# Patient Record
Sex: Male | Born: 1985
Health system: Southern US, Community
[De-identification: ages and names within clinical notes are randomized; demographics above are authoritative.]

## PROBLEM LIST (undated history)

## (undated) DIAGNOSIS — F419 Anxiety disorder, unspecified: Secondary | ICD-10-CM

## (undated) DIAGNOSIS — F410 Panic disorder [episodic paroxysmal anxiety] without agoraphobia: Secondary | ICD-10-CM

## (undated) DIAGNOSIS — F319 Bipolar disorder, unspecified: Secondary | ICD-10-CM

## (undated) HISTORY — DX: Panic disorder (episodic paroxysmal anxiety): F41.0

## (undated) HISTORY — DX: Anxiety disorder, unspecified: F41.9

## (undated) HISTORY — DX: Bipolar disorder, unspecified: F31.9

## (undated) HISTORY — PX: NO PAST SURGERIES: SHX2092

---
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
## 2007-08-23 ENCOUNTER — Emergency Department: Payer: Self-pay | Admitting: Emergency Medicine

## 2009-04-03 ENCOUNTER — Inpatient Hospital Stay: Payer: Self-pay | Admitting: Psychiatry

## 2010-10-27 IMAGING — CT NM PET METABOLIC BRAIN
1 of 5 series · 7 of 25 positions shown · non-contrast
Comparison: none

REASON FOR EXAM: confusion, ams
COMMENTS:

[Series 3: ct brain 3.0 h31s · axial · 3.0mm · 0.49mm/px · z∈[-474,-350]mm · 7 of 110 slices shown]
[im 14/110  brain]
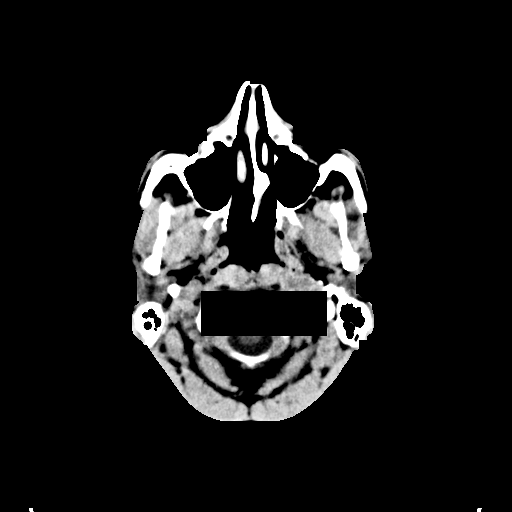
[im 28/110  brain]
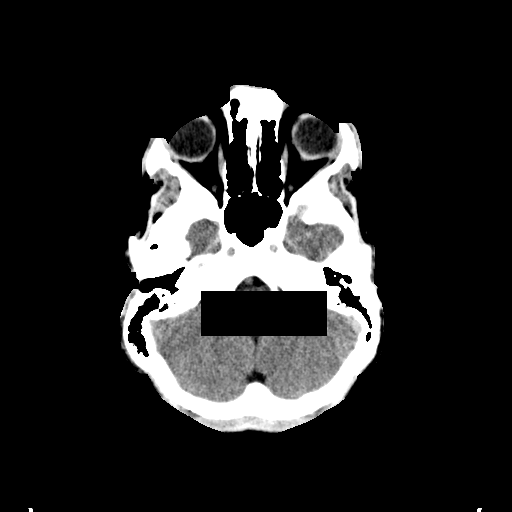
[im 41/110  brain]
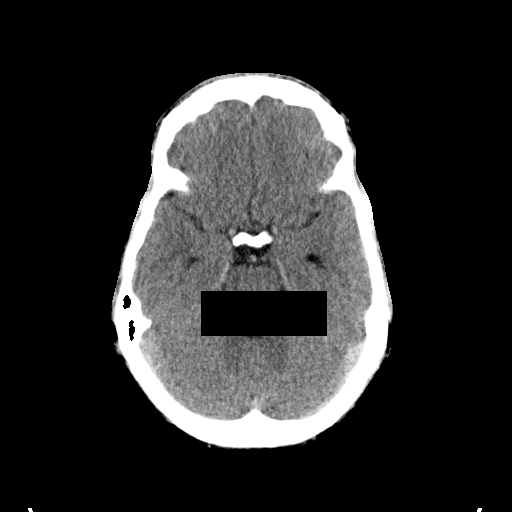
[im 55/110  brain]
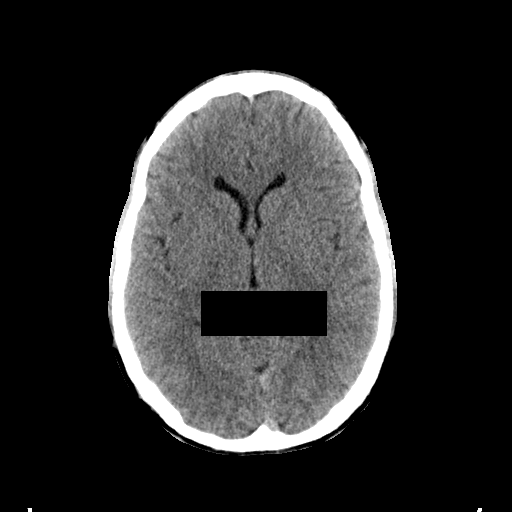
[im 69/110  brain]
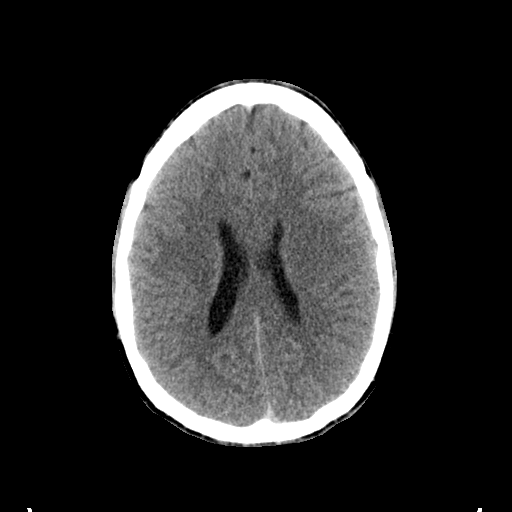
[im 82/110  brain]
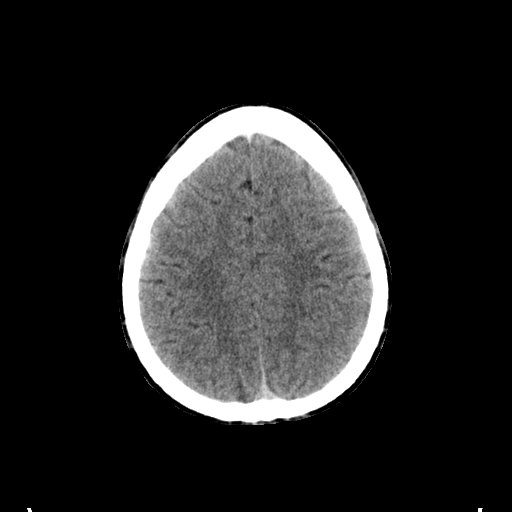
[im 96/110  brain]
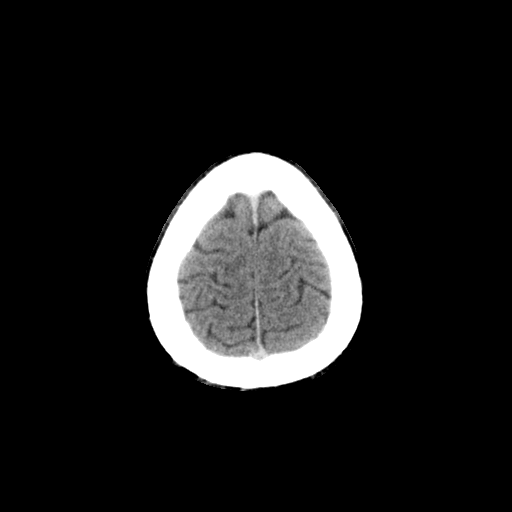

[7 of 25 positions shown; findings below may reference images not displayed]

PROCEDURE:     PET - PET/CT BRAIN PREOP SEIZURE  - April 07, 2009 [DATE]

RESULT:     The patient's fasting blood glucose level was 89 mg/dl. The
patient received an injection of 11.74 mCi of fluorine 18 labeled
fluorodeoxyglucose in the right antecubital region at [DATE] a.m. Imaging is
performed through the brain. There is no previous exam for comparison.

The ventricles and sulci are normal. There is no territorial infarct. There
does not appear to be atrophy or chronic small vessel ischemic disease.
There is no mass effect or evidence of hemorrhage. Distribution of
radiotracer appears symmetric and normal. No abnormal areas of increased or
decreased activity are demonstrated.
IMPRESSION: Normal PET CT of the brain.

## 2012-01-09 ENCOUNTER — Inpatient Hospital Stay: Payer: Self-pay | Admitting: Internal Medicine

## 2012-01-09 LAB — CBC WITH DIFFERENTIAL/PLATELET
Basophil #: 0.1 10*3/uL (ref 0.0–0.1)
Basophil %: 0.7 %
Eosinophil #: 0 10*3/uL (ref 0.0–0.7)
HCT: 41.1 % (ref 40.0–52.0)
Lymphocyte #: 1.1 10*3/uL (ref 1.0–3.6)
Lymphocyte %: 8.2 %
MCH: 30.1 pg (ref 26.0–34.0)
MCHC: 34.5 g/dL (ref 32.0–36.0)
MCV: 87 fL (ref 80–100)
Neutrophil #: 11.3 10*3/uL — ABNORMAL HIGH (ref 1.4–6.5)
RDW: 13.2 % (ref 11.5–14.5)

## 2012-01-09 LAB — COMPREHENSIVE METABOLIC PANEL
BUN: 19 mg/dL — ABNORMAL HIGH (ref 7–18)
Bilirubin,Total: 1.9 mg/dL — ABNORMAL HIGH (ref 0.2–1.0)
Chloride: 103 mmol/L (ref 98–107)
Co2: 26 mmol/L (ref 21–32)
Creatinine: 1.08 mg/dL (ref 0.60–1.30)
EGFR (African American): >60
EGFR (Non-African Amer.): >60
Osmolality: 278 (ref 275–301)
Potassium: 3.7 mmol/L (ref 3.5–5.1)
Sodium: 138 mmol/L (ref 136–145)
Total Protein: 7.4 g/dL (ref 6.4–8.2)

## 2012-01-09 LAB — URINALYSIS, COMPLETE
Bacteria: NONE SEEN
Blood: NEGATIVE
Nitrite: NEGATIVE
Ph: 7 (ref 4.5–8.0)
Protein: 30
Specific Gravity: 1.029 (ref 1.003–1.030)

## 2012-01-10 LAB — CBC WITH DIFFERENTIAL/PLATELET
Eosinophil #: 0.1 10*3/uL (ref 0.0–0.7)
Eosinophil %: 1 %
HCT: 38.5 % — ABNORMAL LOW (ref 40.0–52.0)
Lymphocyte %: 11.2 %
MCH: 29.8 pg (ref 26.0–34.0)
MCV: 88 fL (ref 80–100)
Monocyte #: 1.1 10*3/uL — ABNORMAL HIGH (ref 0.0–0.7)
Neutrophil %: 76.9 %
Platelet: 209 10*3/uL (ref 150–440)
WBC: 10.8 10*3/uL — ABNORMAL HIGH (ref 3.8–10.6)

## 2012-01-10 LAB — BASIC METABOLIC PANEL
Anion Gap: 9 (ref 7–16)
Creatinine: 0.98 mg/dL (ref 0.60–1.30)
EGFR (Non-African Amer.): >60
Osmolality: 283 (ref 275–301)

## 2012-01-11 LAB — CBC WITH DIFFERENTIAL/PLATELET
Basophil #: 0 10*3/uL (ref 0.0–0.1)
Eosinophil #: 0.2 10*3/uL (ref 0.0–0.7)
MCH: 29.7 pg (ref 26.0–34.0)
MCV: 90 fL (ref 80–100)
Monocyte #: 1.1 10*3/uL — ABNORMAL HIGH (ref 0.0–0.7)
Neutrophil #: 7.1 10*3/uL — ABNORMAL HIGH (ref 1.4–6.5)
Neutrophil %: 72.2 %
Platelet: 190 10*3/uL (ref 150–440)
RDW: 13.2 % (ref 11.5–14.5)
WBC: 9.8 10*3/uL (ref 3.8–10.6)

## 2012-01-11 LAB — URINE CULTURE

## 2012-01-12 LAB — CBC WITH DIFFERENTIAL/PLATELET
Basophil %: 0.5 %
Eosinophil %: 1.8 %
HGB: 12.4 g/dL — ABNORMAL LOW (ref 13.0–18.0)
Lymphocyte %: 17.7 %
MCH: 30 pg (ref 26.0–34.0)
Monocyte #: 0.7 10*3/uL (ref 0.0–0.7)
Monocyte %: 8.6 %
Neutrophil %: 71.4 %
RBC: 4.12 10*6/uL — ABNORMAL LOW (ref 4.40–5.90)

## 2012-01-12 LAB — SEDIMENTATION RATE: Erythrocyte Sed Rate: 44 mm/hr — ABNORMAL HIGH (ref 0–15)

## 2012-01-13 LAB — CBC WITH DIFFERENTIAL/PLATELET
Basophil #: 0.1 10*3/uL (ref 0.0–0.1)
Basophil %: 0.7 %
Eosinophil #: 0.2 10*3/uL (ref 0.0–0.7)
Eosinophil %: 3.4 %
Lymphocyte %: 27 %
MCH: 29.6 pg (ref 26.0–34.0)
MCHC: 33.5 g/dL (ref 32.0–36.0)
Monocyte %: 9.8 %
Neutrophil %: 59.1 %
Platelet: 236 10*3/uL (ref 150–440)
RBC: 4.17 10*6/uL — ABNORMAL LOW (ref 4.40–5.90)

## 2013-07-30 IMAGING — CR DG KNEE COMPLETE 4+V*R*
1 series · 4 of 4 positions shown · non-contrast
Comparison: none

REASON FOR EXAM: pain, swelling right knee
COMMENTS:

[Series 1: ap · 0.17mm/px · 4 of 4 slices shown]
[im 1/4]
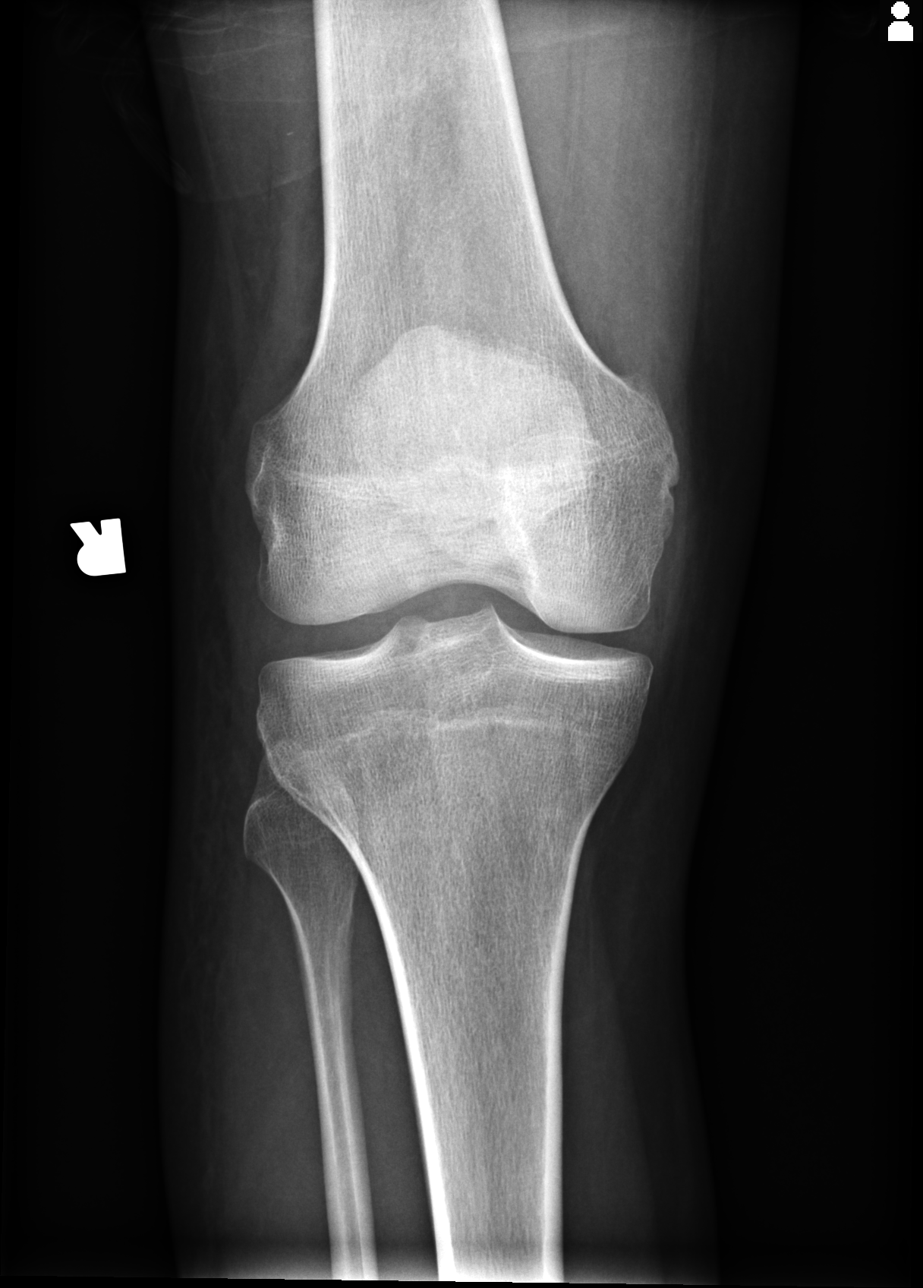
[im 2/4]
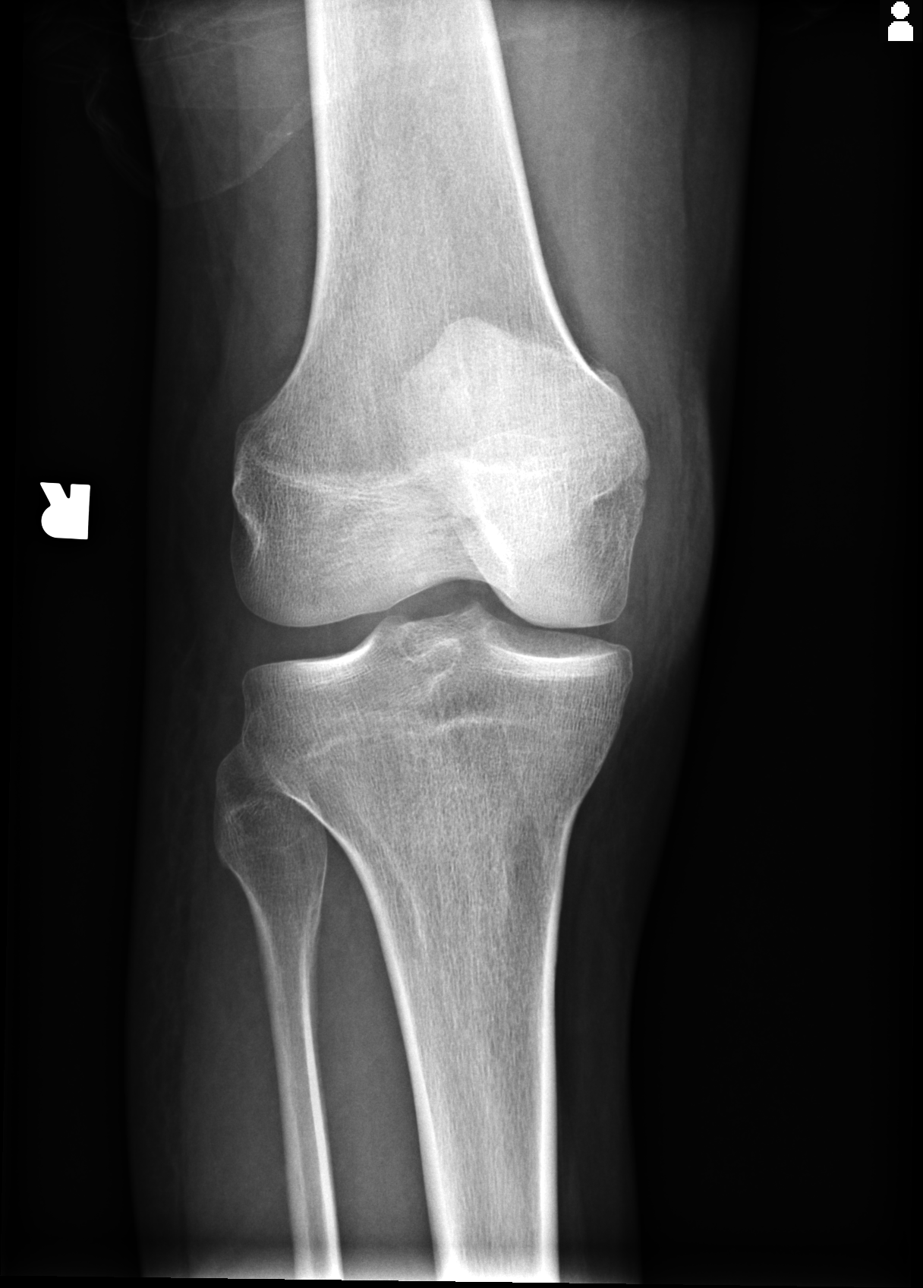
[im 3/4]
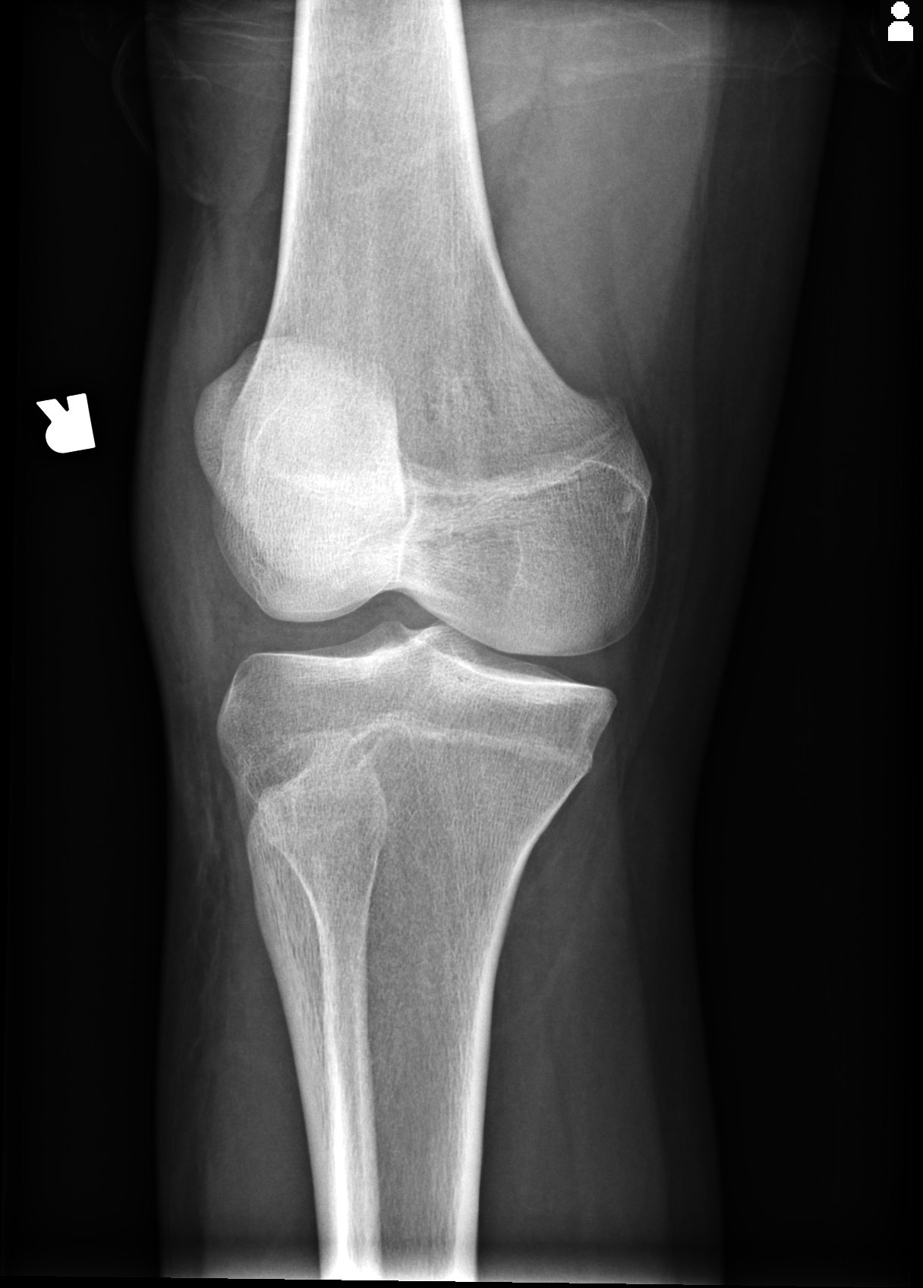
[im 4/4]
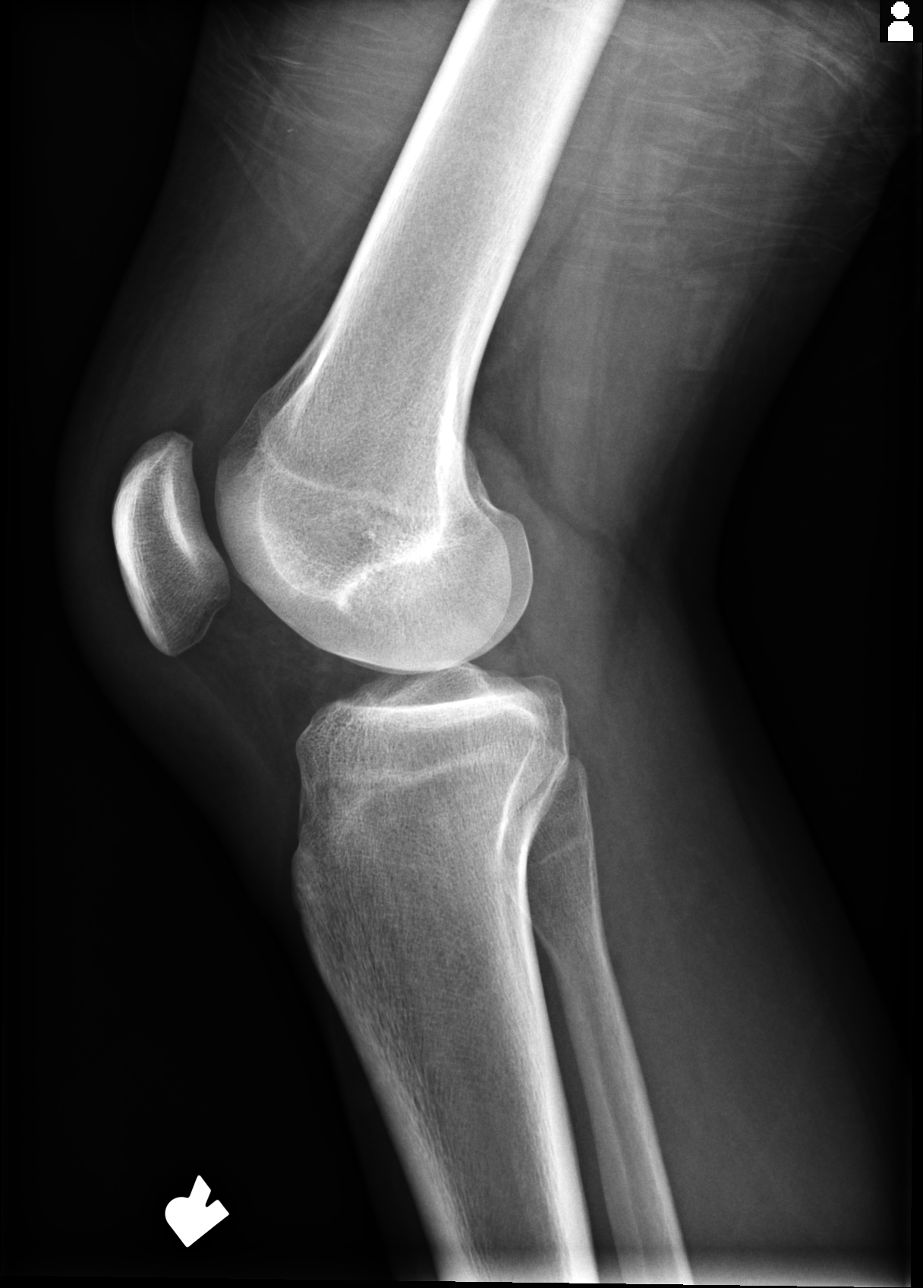

[4 of 4 positions shown; findings below may reference images not displayed]

PROCEDURE:     DXR - DXR KNEE RT COMP WITH OBLIQUES  - January 09, 2012  [DATE]

RESULT:     Four views of the knee were obtained. There is a transverse
radiodensity in the proximal fibula compatible with the epiphyseal closure
site. No fracture, dislocation or other acute bony abnormality is
identified. The knee joint space is well maintained. The patella is intact.
IMPRESSION: 1. No acute bony abnormalities are identified.
2. No soft tissue foreign body is seen.

## 2015-03-12 NOTE — Discharge Summary (Signed)
PATIENT NAME:  [name], Jeffery M MR#:  [id number] DATE OF BIRTH:  Oct 02, 1986  DATE OF ADMISSION:  01/09/2012 DATE OF DISCHARGE:  01/13/2012  DISCHARGE DIAGNOSES:  1. Right knee bursitis/cellulitis with systemic inflammatory response syndrome. 2. Bipolar disorder.  3. Leukocytosis, resolved.   DISPOSITION: The patient is being discharged home.   FOLLOWUP:  1. Follow up with Dr. Martha ClanKrasinski in 1 to 2 days after discharge. 2. Follow up with primary care physician, Dr. Thana AtesMorrisey, within 1 to 2 days of discharge.   DISCHARGE MEDICATIONS:  1. Bactrim Double Strength 1 tablet p.o. for 10 days.  2. Norco 5/325, 1 to 2 tablets p.o. every 4 to 6 hours p.r.n. severe pain.   CONSULTATIONS: Orthopedic consultation with Dr. Martha ClanKrasinski.   LABORATORY, DIAGNOSTIC AND RADIOLOGICAL DATA:  White count 3.7 on admission, normal by the time of discharge. Essentially normal platelet count. Hemoglobin ranging from 14.2 to 12.4. Normal comprehensive metabolic panel.  Blood cultures no growth so far.  Right knee x-ray: No acute abnormality.   HOSPITAL COURSE: The patient is 29 year old male with no significant past medical history other than bipolar disorder, not on any medications, who has to do a lot of kneeling at his place of work. He presented with pain and swelling of his right knee associated with fever, leukocytosis and tachycardia. The patient was admitted with a diagnosis of likely cellulitis/bursitis with SIRS and started on empiric vancomycin. Subsequently, he was changed on Ancef with some aggravation of the cellulitis/bursitis. He was switched back to vancomycin with good improvement. He has minimal erythema and swelling in his right knee. The patient is able to flex his knee completely with normal range of motion. He is ambulating without difficulty. He is being switched to oral Bactrim. Outpatient follow up with Dr. Martha ClanKrasinski has been arranged within 1 to 2 days. His leukocytosis has resolved. The  patient is being discharged home in a stable condition.   TIME SPENT: 45 minutes.  ____________________________ Darrick MeigsSangeeta Jermarion Poffenberger, MD sp:cbb D: 01/13/2012 16:02:57 ET T: 01/14/2012 12:25:28 ET JOB#: [id number]  cc: Darrick MeigsSangeeta Markela Wee, MD, <Dictator> Dennison MascotLemont Morrisey, MD Darrick MeigsSANGEETA Zyia Kaneko MD ELECTRONICALLY SIGNED 01/15/2012 14:11

## 2015-03-12 NOTE — Consult Note (Signed)
Brief Consult Note: Diagnosis: Right knee swelling and pain.   Patient was seen by consultant.   Recommend further assessment or treatment.   Discussed with Attending MD.   Comments: 29 y/o male awoke this AM with pain and swelling in his right knee.  He states that he has not had any recent knee trauma or open wounds that he can recall.  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  He states he does not use knee pads.  Patient states that his pain has improved since earlier today when he had difficulty bending his knee without discomfort, and he has been able to bear weight on it throughout the day.  He had a fever of 102 today and has a WBC of 13.7. On exam, he is seen with a male companion at the bedside in the hallway of the ER.  He does not appear ill and is in no acute distress.  The right knee is erythematous over the patella and there is mild faint streaking distally over the anterolateral tibia.  There is focal swelling over the patella consistent with bursitis, but no evidence of a knee effusion.  His range of motion is from 0-120 degrees with moderate discomfort in maximum flexion, but not in the midrange of motion.  He can perform a straight leg raise without lag.  His lower leg compartments are soft and compressible.  He has no calf tenderness.  He has 5/5 strength in all lower leg muscles to manual strength testing, intact sensation to light touch and palpable pedal pulses.  He is tender to palpation over the patella.  Assessment:  Right knee/leg cellulitis with possible septic bursitis.  No evidence for septic right knee joint.  I agree with admission to [facility] for administration of IV antibiotics for treatment of cellulitis.  I will continue to follow.  No indication for surgery at this time.  Will assess the patient's response to IV antibiotlcs tomorrow.  Electronic Signatures: Juanell FairlyKrasinski, Fabienne Nolasco (MD)  (Signed 21-Feb-13 21:50)  Authored: Brief Consult Note   Last  Updated: 21-Feb-13 21:50 by Juanell FairlyKrasinski, Seona Clemenson (MD)

## 2015-03-12 NOTE — H&P (Signed)
PATIENT NAME:  Jeffery Gomez, Jeffery Gomez MR#:  [id number] DATE OF BIRTH:  [birth year]  DATE OF ADMISSION:  01/09/2012  PRIMARY CARE PHYSICIAN: None.   CHIEF COMPLAINT: Swelling in the right knee.   HISTORY OF PRESENT ILLNESS: The patient is a 29 year old male with history of bipolar disorder. Today he presented to the Emergency Room with swelling of the right knee that started this morning after he woke up. He was complaining of some redness over the right knee area and painful right knee. 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 He never had any joint problems in the past. He has no previous history of gout. He said he was having fever today also to about 102/103. He had a documented fever of 102 in the Emergency Room. He was slightly nauseous, slightly dizzy. He worked until 3 o'clock and then he presented to the Emergency Room. When he came into the ER, he had a fever of 102, heart rate 128, and his white count was found to be 13.7. He said he is able to walk some on the right leg. He got a dose of vancomycin and ceftriaxone in the Emergency Room and 800 mg of ibuprofen. Hospitalist was asked to admit the patient because of possible bursitis, maybe infected bursitis. He is also complaining of a swollen lymph node draining in the same area in the right groin. Denies any abdominal pain. No chest pain or shortness of breath. He was also complaining of some neck pain before and sharp shooting pain in the lower back. Denies any neck stiffness.   REVIEW OF SYSTEMS: Positive for fever. No acute change in vision. No headache. He was complaining of some dizziness. No cough. No dyspnea. No chest pain or shortness of breath. No palpitations. Mild nausea but no vomiting, abdominal pain, diarrhea. No dysuria. No frequency. No anemia. No bleeding from any site. No rash. He is complaining of significant right knee pain. No focal numbness or weakness. He has  history of bipolar disorder.   PAST MEDICAL HISTORY: Bipolar disorder. He was admitted to Behavioral Medicine in May of 2010 because of depression.   PAST SURGICAL HISTORY: None.   HOME MEDICATIONS: None.   ALLERGIES: Depakote.   SOCIAL HISTORY: He works doing Pension scheme managerpainting jobs on cars. He chews tobacco. He drinks alcohol socially. Denies any drug use.   FAMILY HISTORY: No significant medical conditions as per the patient.   PHYSICAL EXAMINATION:   VITAL SIGNS: When he presented to the Emergency Room, temperature 102, heart rate 128, respiratory rate 24, blood pressure 119/64, saturating 98% on room air.   GENERAL: This is a young Caucasian male comfortably sitting in bed nontoxic in appearance.   HEENT: Bilateral pupils are equal and reactive. Extraocular muscles intact. No scleral icterus. No conjunctivitis. Oral mucosa is moist. No pallor.   NECK: No thyroid tenderness, enlargement, or nodules. Neck is supple. No masses, nontender. No adenopathy. No JVD. No carotid bruits.   CHEST: Bilateral breath sounds are clear. No wheeze. Normal effort. No respiratory distress.   HEART: Heart sounds are regular. No murmur. Good peripheral pulses. No lower extremity edema.   ABDOMEN: Soft, nontender. Normal bowel sounds. No hepatosplenomegaly. No bruit. No masses.   RECTAL: Deferred.   NEUROLOGIC: He is awake, alert, oriented to time, place, and person. Cranial nerves are intact. Moving all extremities against gravity.   EXTREMITIES: No cyanosis. No clubbing.   SKIN: His  right knee is swollen compared to the left side. It is mostly anteriorly. No posterior effusion. He has erythema on the anterior aspect. He has a chronically inflamed area at the lower part of the right knee. He has some mild tenderness on extension of the knee.   LABORATORY, DIAGNOSTIC, AND RADIOLOGICAL DATA: White count 13.7, hemoglobin 14.2, platelet count 229,000. BMP sodium 138, potassium 3.7, BUN 19, creatinine 1.08,  bilirubin 1.9, otherwise, ALT and AST normal.   IMPRESSION: Painful right knee, suspect infected bursitis, rule out septic arthritis, systemic inflammatory response syndrome with leukocytosis, tachycardia, and fever secondary to infected bursitis, leukocytosis.   PLAN: This is a 29 year old male with history of bipolar disorder not taking any medications at home, no history of any trauma to the knee, who presented with sudden onset of right knee pain, swelling, and redness. 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 He has some evidence of systemic inflammatory response syndrome also with fever, leukocytosis, and tachycardia. It looks like an infected bursitis. Discussed with Orthopedics. I'Gomez going to start him on IV vancomycin. As per Orthopedics, usually bursitis responds to IV antibiotics. If he does not respond, then he may need arthrocentesis tomorrow. Will get an Orthopedic consult on him. Will also give him IV hydration. I'll start him on some nonsteroidal anti-inflammatory agent also and give him Norco p.r.n.   Plan was discussed with the patient, also discussed the case with Dr. Martha Clan.   TIME SPENT WITH ADMISSION AND COORDINATION OF CARE: 55 minutes.    ____________________________ Fredia Sorrow, MD ag:drc D: 01/09/2012 21:12:54 ET T: 01/10/2012 06:03:54 ET JOB#: [id number]  cc: Fredia Sorrow, MD, <Dictator> Fredia Sorrow MD ELECTRONICALLY SIGNED 01/27/2012 11:32

## 2017-12-30 ENCOUNTER — Ambulatory Visit: Payer: No Typology Code available for payment source | Admitting: Family Medicine

## 2017-12-30 ENCOUNTER — Encounter: Payer: Self-pay | Admitting: Family Medicine

## 2017-12-30 VITALS — BP 120/70 | HR 100 | Temp 98.4°F | Resp 18 | Ht 69.0 in | Wt 164.2 lb

## 2017-12-30 DIAGNOSIS — F411 Generalized anxiety disorder: Secondary | ICD-10-CM

## 2017-12-30 DIAGNOSIS — Z7689 Persons encountering health services in other specified circumstances: Secondary | ICD-10-CM | POA: Diagnosis not present

## 2017-12-30 DIAGNOSIS — Z23 Encounter for immunization: Secondary | ICD-10-CM

## 2017-12-30 DIAGNOSIS — F419 Anxiety disorder, unspecified: Secondary | ICD-10-CM | POA: Insufficient documentation

## 2017-12-30 DIAGNOSIS — F330 Major depressive disorder, recurrent, mild: Secondary | ICD-10-CM

## 2017-12-30 MED ORDER — ESCITALOPRAM OXALATE 10 MG PO TABS: 10.0000 mg | ORAL_TABLET | Freq: Every day | ORAL | 0 refills | Status: DC

## 2017-12-30 MED ORDER — HYDROXYZINE HCL 10 MG PO TABS: 10.0000 mg | ORAL_TABLET | Freq: Two times a day (BID) | ORAL | 0 refills | Status: DC | PRN

## 2017-12-30 NOTE — Patient Instructions (Addendum)
PLEASE go to this link to find a therapist to help with techniques to calm your anxiety:   https://www.psychologytoday.com/us/therapists  Here are some resources to help you if you feel you are in a mental health crisis:  National Suicide Prevention Lifeline - Call 307-733-1289  for help - Website with more resources: ARanked.fi  [employer] [facility] - Call 216-609-1201 for help. - Mobile Crisis Program available 24 hours a day, 365 days a year. - Available for anyone of any age in [place] & [place] counties.  [facility] - Address: [address], [place] - Telephone: [phone]  - Hours of Operation: Sunday - Saturday - 8:00 a.m. - 8:00 p.m. - Medicaid, Medicare (Government Issued Only), BCBS, and [employer] - Pay - Crisis Management, Outpatient Individual & Group Therapy, Psychiatrists on-site to provide medication management, In-Home Psychiatric Care, and Peer Support Care.  [facility] - Call [phone] for help. - Mobile Crisis Program available 24 hours a day, 365 days a year. - Available for anyone of any age in [place] & [place]  12 Ways to Curb Anxiety  ?Anxiety is normal human sensation. It is what helped our ancestors survive the pitfalls of the wilderness. Anxiety is defined as experiencing worry or nervousness about an imminent event or something with an uncertain outcome. It is a feeling experienced by most people at some point in their lives. Anxiety can be triggered by a very personal issue, such as the illness of a loved one, or an event of global proportions, such as a refugee crisis. Some of the symptoms of anxiety are:  Feeling restless.  Having a feeling of impending danger.  Increased heart rate.  Rapid breathing. Sweating.  Shaking.  Weakness or feeling tired.  Difficulty concentrating on anything except the current worry.  Insomnia.  Stomach  or bowel problems. What can we do about anxiety we may be feeling? There are many techniques to help manage stress and relax. Here are 12 ways you can reduce your anxiety almost immediately: 1. Turn off the constant feed of information. Take a social media sabbatical. Studies have shown that social media directly contributes to social anxiety.  2. Monitor your television viewing habits. Are you watching shows that are also contributing to your anxiety, such as 24-hour news stations? Try watching something else, or better yet, nothing at all. Instead, listen to music, read an inspirational book or practice a hobby. 3. Eat nutritious meals. Also, don't skip meals and keep healthful snacks on hand. Hunger and poor diet contributes to feeling anxious. 4. Sleep. Sleeping on a regular schedule for at least seven to eight hours a night will do wonders for your outlook when you are awake. 5. Exercise. Regular exercise will help rid your body of that anxious energy and help you get more restful sleep. 6. Try deep (diaphragmatic) breathing. Inhale slowly through your nose for five seconds and exhale through your mouth. 7. Practice acceptance and gratitude. When anxiety hits, accept that there are things out of your control that shouldn't be of immediate concern.  8. Seek out humor. When anxiety strikes, watch a funny video, read jokes or call a friend who makes you laugh. Laughter is healing for our bodies and releases endorphins that are calming. 9. Stay positive. Take the effort to replace negative thoughts with positive ones. Try to see a stressful situation in a positive light. Try to come up with solutions rather than dwelling on the problem. 10. Figure out  what triggers your anxiety. Keep a journal and make note of anxious moments and the events surrounding them. This will help you identify triggers you can avoid or even eliminate. 11. Talk to someone. Let a trusted friend, family member or even trained  professional know that you are feeling overwhelmed and anxious. Verbalize what you are feeling and why.  12. Volunteer. If your anxiety is triggered by a crisis on a large scale, become an advocate and work to resolve the problem that is causing you unease. Anxiety is often unwelcome and can become overwhelming. If not kept in check, it can become a disorder that could require medical treatment. However, if you take the time to care for yourself and avoid the triggers that make you anxious, you will be able to find moments of relaxation and clarity that make your life much more enjoyable.

## 2017-12-30 NOTE — Progress Notes (Signed)
Name: Jeffery Gomez   MRN: [id number]    DOB: 04-Aug-1986   Date:12/30/2017       Progress Note  Subjective  Chief Complaint  Chief Complaint  Patient presents with  . Establish Care  . Anxiety  . Stress  . Shortness of Breath    HPI  Anxiety: He notes that he had some episodes of severe depression and anxiety back when he was 14-32yo that came and went until he was about 32 years old - he says this was typically after he drank heavily from alcohol or some kind of stressful trigger.  Over the last 12 years he has had anxiety that comes and goes - tries breathing exercises, getting good amounts of sleep, and tries to avoid caffeine as much as possible.  He bites his nails and still experiences several days a week with anxiety - says he feels like he "ponders" on certain things, sometimes has mild shortness of breath with anxiety.   Denies any visual, auditory, or tactile hallucinations, SI/HI.  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  He is married, has 3 children (68 (boy), 75 (girl), and 4(boy).  He tried Lexapro in the past and this worked very well for him.    There are no active problems to display for this patient.  Past Surgical History:  Procedure Laterality Date  . NO PAST SURGERIES     No family history on file.  Social History   Socioeconomic History  . Marital status: Single    Spouse name: Not on file  . Number of children: Not on file  . Years of education: Not on file  . Highest education level: Not on file  Social Needs  . Financial resource strain: Not on file  . Food insecurity - worry: Not on file  . Food insecurity - inability: Not on file  . Transportation needs - medical: Not on file  . Transportation needs - non-medical: Not on file  Occupational History  . Not on file  Tobacco Use  . Smoking status: Never Smoker  . Smokeless tobacco: Current User    Types: Chew  Substance and Sexual Activity  . Alcohol use: Yes    Comment: occasinal  .  Drug use: No  . Sexual activity: Yes  Other Topics Concern  . Not on file  Social History Narrative  . Not on file    No current outpatient medications on file.  Allergies  Allergen Reactions  . Depakote [Divalproex Sodium] Anaphylaxis    ROS Constitutional: Negative for fever or weight change.  Respiratory: Negative for cough; see HPI  Cardiovascular: Negative for chest pain or palpitations.  Gastrointestinal: Negative for abdominal pain, no bowel changes.  Musculoskeletal: Negative for gait problem or joint swelling.  Skin: Negative for rash.  Neurological: Negative for dizziness or headache.  Psych: See HPI No other specific complaints in a complete review of systems (except as listed in HPI above).  Objective  Vitals:   12/30/17 0924  BP: 120/70  Pulse: 100  Resp: 18  Temp: 98.4 F (36.9 C)  TempSrc: Oral  SpO2: 96%  Weight: 164 lb 3.2 oz (74.5 kg)  Height: 5\' 9"  (1.753 m)   Body mass index is 24.25 kg/m.  Physical Exam Constitutional: Patient appears well-developed and well-nourished. No distress.  HENT: Head: Normocephalic and atraumatic. Ears: B TMs ok, no erythema or effusion; Nose: Nose normal. Mouth/Throat: Oropharynx is clear and moist. No oropharyngeal exudate.  Eyes:  Conjunctivae and EOM are normal. Pupils are equal, round, and reactive to light. No scleral icterus.  Neck: Normal range of motion. Neck supple. No JVD present. No thyromegaly present.  Cardiovascular: Normal rate, regular rhythm and normal heart sounds.  No murmur heard. No BLE edema. Pulmonary/Chest: Effort normal and breath sounds normal. No respiratory distress. Abdominal: Soft. Bowel sounds are normal, no distension. There is no tenderness. no masses Musculoskeletal: Normal range of motion, no joint effusions. No gross deformities Neurological: he is alert and oriented to person, place, and time. No cranial nerve deficit. Coordination, balance, strength, speech and gait are normal.   Skin: Skin is warm and dry. No rash noted. No erythema.  Psychiatric: Patient has a normal mood and affect. behavior is normal. Judgment and thought content normal.  No results found for this or any previous visit (from the past 72 hour(s)).  PHQ2/9: Depression screen [facility] 12/30/2017 12/30/2017  Decreased Interest 1 1  Down, Depressed, Hopeless 1 0  PHQ - 2 Score 2 1  Altered sleeping 0 -  Tired, decreased energy 3 -  Change in appetite 0 -  Feeling bad or failure about yourself  1 -  Trouble concentrating 3 -  Moving slowly or fidgety/restless 0 -  Suicidal thoughts 0 -  PHQ-9 Score 9 -  Difficult doing work/chores Very difficult -   GAD 7 : Generalized Anxiety Score 12/30/2017  Nervous, Anxious, on Edge 3  Control/stop worrying 3  Worry too much - different things 3  Trouble relaxing 2  Restless 3  Easily annoyed or irritable 3  Afraid - awful might happen 3  Total GAD 7 Score 20  Anxiety Difficulty Somewhat difficult    MDQ -  Question 1: 3 Yes Question 2: No Question 3: Minor Problem Question 5: Yes Question 6: Yes Result: Negative Screen  Fall Risk: Fall Risk  12/30/2017  Falls in the past year? No    Assessment & Plan  1. Generalized anxiety disorder - escitalopram (LEXAPRO) 10 MG tablet; Take 1 tablet (10 mg total) by mouth daily.  Dispense: 15 tablet; Refill: 0 - hydrOXYzine (ATARAX/VISTARIL) 10 MG tablet; Take 1 tablet (10 mg total) by mouth 2 (two) times daily as needed.  Dispense: 20 tablet; Refill: 0 - BASIC METABOLIC PANEL WITH GFR - TSH - CBC  2. Mild episode of recurrent major depressive disorder (HCC) - escitalopram (LEXAPRO) 10 MG tablet; Take 1 tablet (10 mg total) by mouth daily.  Dispense: 15 tablet; Refill: 0 - hydrOXYzine (ATARAX/VISTARIL) 10 MG tablet; Take 1 tablet (10 mg total) by mouth 2 (two) times daily as needed.  Dispense: 20 tablet; Refill: 0 - BASIC METABOLIC PANEL WITH GFR - TSH - CBC - We will check labs today to ensure  anxiety is not being created by other pathology - MDQ is negative, so we will proceed with Lexapro at this time, however I advise that he monitor his symptoms closely as he has been told by a previous provider in his teens that he may have bipolar. We will maintain close follow up.  3. Encounter to establish care Return in about 2 weeks (around 01/13/2018) for 2wk Anxiety Follow Up; also schedule CPE w/ fasting labs.  4. Needs flu shot - Flu Vaccine QUAD 6+ mos PF IM (Fluarix Quad PF)  5. Need for Tdap vaccination - Tdap vaccine greater than or equal to 7yo IM

## 2017-12-31 LAB — CBC
HCT: 45 % (ref 38.5–50.0)
Hemoglobin: 15.6 g/dL (ref 13.2–17.1)
MCH: 29.1 pg (ref 27.0–33.0)
MCHC: 34.7 g/dL (ref 32.0–36.0)
MCV: 84 fL (ref 80.0–100.0)
MPV: 9.4 fL (ref 7.5–12.5)
Platelets: 330 10*3/uL (ref 140–400)
RBC: 5.36 10*6/uL (ref 4.20–5.80)
RDW: 12.6 % (ref 11.0–15.0)
WBC: 7.1 10*3/uL (ref 3.8–10.8)

## 2017-12-31 LAB — BASIC METABOLIC PANEL WITH GFR
BUN: 15 mg/dL (ref 7–25)
CO2: 28 mmol/L (ref 20–32)
Calcium: 10.1 mg/dL (ref 8.6–10.3)
Chloride: 104 mmol/L (ref 98–110)
Creat: 0.84 mg/dL (ref 0.60–1.35)
GFR, EST AFRICAN AMERICAN: 134 mL/min/{1.73_m2} (ref >=60)
GFR, Est Non African American: 116 mL/min/{1.73_m2} (ref >=60)
Glucose, Bld: 100 mg/dL (ref 65–139)
Potassium: 4.7 mmol/L (ref 3.5–5.3)
Sodium: 137 mmol/L (ref 135–146)

## 2017-12-31 LAB — TSH: TSH: 1.03 mIU/L (ref 0.40–4.50)

## 2018-01-14 ENCOUNTER — Encounter: Payer: Self-pay | Admitting: Family Medicine

## 2018-01-14 ENCOUNTER — Ambulatory Visit: Payer: No Typology Code available for payment source | Admitting: Family Medicine

## 2018-01-14 VITALS — BP 108/62 | HR 93 | Temp 98.7°F | Resp 18 | Ht 69.0 in | Wt 164.1 lb

## 2018-01-14 DIAGNOSIS — F411 Generalized anxiety disorder: Secondary | ICD-10-CM

## 2018-01-14 DIAGNOSIS — F330 Major depressive disorder, recurrent, mild: Secondary | ICD-10-CM

## 2018-01-14 MED ORDER — ESCITALOPRAM OXALATE 10 MG PO TABS: 10.0000 mg | ORAL_TABLET | Freq: Every day | ORAL | 1 refills | Status: DC

## 2018-01-14 NOTE — Patient Instructions (Addendum)

## 2018-01-14 NOTE — Progress Notes (Signed)
Name: Jeffery Gomez   MRN: [id number]    DOB: [birth year]   Date:01/14/2018       Progress Note  Subjective  Chief Complaint  Chief Complaint  Patient presents with  . Follow-up    2 week recheck on depression    HPI  Anxiety: He started Lexapro 10mg  2 weeks ago and has been feeling progressively better.  He has taken 2 doses of hydroxyzine which helped his anxiety but did make him feel groggy.  Still having episodes of increased anxiety but these have significantly lessened in severity and length of time.  Work has been much more manageable. He denies any SE's - no headaches or nausea.  Denies any visual, auditory, or tactile hallucinations, SI/HI.  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  He is married, has 3 children ([id number] (boy), 4610 (girl), and 4(boy) - he has had more energy to interact with his family and more motivation to get things done around the house.  Patient Active Problem List   Diagnosis Date Noted  . Generalized anxiety disorder 12/30/2017  . Mild episode of recurrent major depressive disorder (HCC) 12/30/2017    Past Surgical History:  Procedure Laterality Date  . NO PAST SURGERIES      Family History  Problem Relation Age of Onset  . Anxiety disorder Mother   . Panic disorder Mother   . Bipolar disorder Father   . Depression Brother   . Anxiety disorder Brother   . Heart attack Maternal Grandmother   . Alcohol abuse Maternal Grandfather   . Cirrhosis Maternal Grandfather   . Thyroid disease Paternal Grandmother     Social History   Socioeconomic History  . Marital status: Single    Spouse name: Not on file  . Number of children: Not on file  . Years of education: Not on file  . Highest education level: Not on file  Social Needs  . Financial resource strain: Not on file  . Food insecurity - worry: Not on file  . Food insecurity - inability: Not on file  . Transportation needs - medical: Not on file   . Transportation needs - non-medical: Not on file  Occupational History  . Not on file  Tobacco Use  . Smoking status: Never Smoker  . Smokeless tobacco: Current User    Types: Chew  Substance and Sexual Activity  . Alcohol use: Yes    Comment: occasinal  . Drug use: No  . Sexual activity: Yes  Other Topics Concern  . Not on file  Social History Narrative  . Not on file     Current Outpatient Medications:  .  escitalopram (LEXAPRO) 10 MG tablet, Take 1 tablet (10 mg total) by mouth daily., Disp: 15 tablet, Rfl: 0 .  hydrOXYzine (ATARAX/VISTARIL) 10 MG tablet, Take 1 tablet (10 mg total) by mouth 2 (two) times daily as needed., Disp: 20 tablet, Rfl: 0  Allergies  Allergen Reactions  . Depakote [Divalproex Sodium] Anaphylaxis    ROS Constitutional: Negative for fever or weight change.  Respiratory: Negative for cough and shortness of breath.   Cardiovascular: Negative for chest pain or palpitations.  Gastrointestinal: Negative for abdominal pain, no bowel changes.  Musculoskeletal: Negative for gait problem or joint swelling.  Skin: Negative for rash.  Neurological: Negative for dizziness or headache.  No other specific complaints in a complete review of systems (except as listed in HPI above).  Objective  Vitals:   01/14/18 0902  BP: 108/62  Pulse: 93  Resp: 18  Temp: 98.7 F (37.1 C)  TempSrc: Oral  SpO2: 96%  Weight: 164 lb 1.6 oz (74.4 kg)  Height: 5\' 9"  (1.753 m)   Body mass index is 24.23 kg/m.  Physical Exam Constitutional: Patient appears well-developed and well-nourished.  No distress.  HEENT: head atraumatic, normocephalic, pupils equal and reactive to light,, neck supple, throat within normal limits Cardiovascular: Normal rate, regular rhythm and normal heart sounds.  No murmur heard. No BLE edema. Pulmonary/Chest: Effort normal and breath sounds normal. No respiratory distress. Abdominal: Soft.  There is no tenderness. Psychiatric: Patient has  a normal mood and affect. behavior is normal. Judgment and thought content normal.  No results found for this or any previous visit (from the past 72 hour(s)).  PHQ2/9: Depression screen [facility] 2/9 01/14/2018 12/30/2017 12/30/2017  Decreased Interest 1 1 1   Down, Depressed, Hopeless 1 1 0  PHQ - 2 Score 2 2 1   Altered sleeping 1 0 -  Tired, decreased energy 1 3 -  Change in appetite 0 0 -  Feeling bad or failure about yourself  0 1 -  Trouble concentrating 1 3 -  Moving slowly or fidgety/restless 0 0 -  Suicidal thoughts 0 0 -  PHQ-9 Score 5 9 -  Difficult doing work/chores Somewhat difficult Very difficult -   GAD 7 : Generalized Anxiety Score 01/14/2018 12/30/2017  Nervous, Anxious, on Edge 2 3  Control/stop worrying 1 3  Worry too much - different things 1 3  Trouble relaxing 1 2  Restless 2 3  Easily annoyed or irritable 1 3  Afraid - awful might happen 0 3  Total GAD 7 Score 8 20  Anxiety Difficulty Somewhat difficult Somewhat difficult   Fall Risk: Fall Risk  12/30/2017  Falls in the past year? No   Assessment & Plan  1. Generalized anxiety disorder - Significant improvement since last visit, follow up in 1 month for CPE - escitalopram (LEXAPRO) 10 MG tablet; Take 1 tablet (10 mg total) by mouth daily.  Dispense: 90 tablet; Refill: 1  2. Mild episode of recurrent major depressive disorder (HCC) - Significant improvement since last visit, follow up in 1 month for CPE - escitalopram (LEXAPRO) 10 MG tablet; Take 1 tablet (10 mg total) by mouth daily.  Dispense: 90 tablet; Refill: 1

## 2018-02-12 ENCOUNTER — Encounter: Payer: No Typology Code available for payment source | Admitting: Family Medicine

## 2018-02-13 ENCOUNTER — Encounter: Payer: No Typology Code available for payment source | Admitting: Family Medicine

## 2018-08-17 ENCOUNTER — Telehealth: Payer: Self-pay | Admitting: Family Medicine

## 2018-08-17 DIAGNOSIS — F330 Major depressive disorder, recurrent, mild: Secondary | ICD-10-CM

## 2018-08-17 DIAGNOSIS — F411 Generalized anxiety disorder: Secondary | ICD-10-CM

## 2018-08-17 NOTE — Telephone Encounter (Signed)
Please schedule follow up appointment and separate CPE for patient.  He needs a follow up in the next month.

## 2018-08-17 NOTE — Telephone Encounter (Signed)
Pt made an appt for Oct 22nd but is calling his new insr to make sure that we are in network. Will schedule cpe when he comes for his appt in oct.

## 2018-09-08 ENCOUNTER — Ambulatory Visit: Payer: No Typology Code available for payment source | Admitting: Family Medicine

## 2018-09-08 ENCOUNTER — Encounter: Payer: Self-pay | Admitting: Family Medicine

## 2018-09-08 VITALS — BP 128/70 | HR 83 | Temp 98.2°F | Ht 69.0 in | Wt 166.6 lb

## 2018-09-08 DIAGNOSIS — F411 Generalized anxiety disorder: Secondary | ICD-10-CM | POA: Diagnosis not present

## 2018-09-08 DIAGNOSIS — Z716 Tobacco abuse counseling: Secondary | ICD-10-CM

## 2018-09-08 DIAGNOSIS — F330 Major depressive disorder, recurrent, mild: Secondary | ICD-10-CM | POA: Diagnosis not present

## 2018-09-08 MED ORDER — ESCITALOPRAM OXALATE 10 MG PO TABS: 10.0000 mg | ORAL_TABLET | Freq: Every day | ORAL | 3 refills | Status: DC

## 2018-09-08 MED ORDER — NICOTINE 21-14-7 MG/24HR TD KIT: 1.0000 | PACK | Freq: Every day | TRANSDERMAL | 0 refills | Status: DC

## 2018-09-08 NOTE — Progress Notes (Signed)
Name: Jeffery Gomez   MRN: [id number]    DOB: Jan 25, 1986   Date:09/08/2018       Progress Note  Subjective  Chief Complaint  Chief Complaint  Patient presents with  . Follow-up    HPI  Pt in for follow up on generalized anxiety disorder and depression. Started Lexapro 48m daily in February and reports significant improvement with this. Denies sleep disturbances, denies further panic attacks, denies PRN use of hydroxyzine in months. Reports he has noticed and improved mood since starting Lexapro and has had more energy during the day.   Tobacco Abuse - he is using dip 1.5 cans every 2 days. He would like to stop using.  Discussed wellbutrin and chantix - he prefers to use patches.  Discussed choosing a quit date.  Patient Active Problem List   Diagnosis Date Noted  . Generalized anxiety disorder 12/30/2017  . Mild episode of recurrent major depressive disorder ([place] 12/30/2017    Past Surgical History:  Procedure Laterality Date  . NO PAST SURGERIES      Family History  Problem Relation Age of Onset  . Anxiety disorder Mother   . Panic disorder Mother   . Bipolar disorder Father   . Depression Brother   . Anxiety disorder Brother   . Heart attack Maternal Grandmother   . Alcohol abuse Maternal Grandfather   . Cirrhosis Maternal Grandfather   . Thyroid disease Paternal Grandmother     Social History   Socioeconomic History  . Marital status: Single    Spouse name: Not on file  . Number of children: Not on file  . Years of education: Not on file  . Highest education level: Not on file  Occupational History  . Not on file  Social Needs  . Financial resource strain: Not on file  . Food insecurity:    Worry: Not on file    Inability: Not on file  . Transportation needs:    Medical: Not on file    Non-medical: Not on file  Tobacco Use  . Smoking status: Never Smoker  . Smokeless tobacco: Current User    Types: Chew  Substance and Sexual Activity  .  Alcohol use: Yes    Comment: occasinal  . Drug use: No  . Sexual activity: Yes  Lifestyle  . Physical activity:    Days per week: Not on file    Minutes per session: Not on file  . Stress: Not on file  Relationships  . Social connections:    Talks on phone: Not on file    Gets together: Not on file    Attends religious service: Not on file    Active member of club or organization: Not on file    Attends meetings of clubs or organizations: Not on file    Relationship status: Not on file  . Intimate partner violence:    Fear of current or ex partner: Not on file    Emotionally abused: Not on file    Physically abused: Not on file    Forced sexual activity: Not on file  Other Topics Concern  . Not on file  Social History Narrative  . Not on file     Current Outpatient Medications:  .  escitalopram (LEXAPRO) 10 MG tablet, TAKE 1 TABLET BY MOUTH EVERY DAY, Disp: 30 tablet, Rfl: 0 .  hydrOXYzine (ATARAX/VISTARIL) 10 MG tablet, Take 1 tablet (10 mg total) by mouth 2 (two) times daily as needed. (Patient not taking:  Reported on 09/08/2018), Disp: 20 tablet, Rfl: 0  Allergies  Allergen Reactions  . Depakote [Divalproex Sodium] Anaphylaxis    I personally reviewed active problem list, medication list, allergies, social history, health maintenance, notes from last encounter with the patient/caregiver today.   ROS Constitutional: Negative for fever or weight change.  Respiratory: Negative for cough and shortness of breath.   Cardiovascular: Negative for chest pain or palpitations.  Gastrointestinal: Negative for abdominal pain, no bowel changes.  Musculoskeletal: Negative for gait problem or joint swelling.  Skin: Negative for rash.  Neurological: Negative for dizziness or headache.  No other specific complaints in a complete review of systems (except as listed in HPI above).   Objective  Vitals:   09/08/18 0937  BP: 128/70  Pulse: 83  Temp: 98.2 F (36.8 C)  SpO2: 98%   Weight: 166 lb 9.6 oz (75.6 kg)  Height: _0  (1.753 m)     Body mass index is 24.6 kg/m.  Physical Exam Constitutional: Patient appears well-developed and well-nourished. No distress.  Neck: Normal range of motion. Neck supple. No JVD present.  Cardiovascular: Normal rate, regular rhythm and normal heart sounds.  No murmur heard. No BLE edema. Pulmonary/Chest: Effort normal and breath sounds normal. No respiratory distress. Musculoskeletal: Normal range of motion, no joint effusions. No gross deformities Neurological: he is alert and oriented to person, place, and time. No cranial nerve deficit. Coordination, balance, strength, speech and gait are normal.  Skin: Skin is warm and dry. No rash noted. No erythema.  Psychiatric: Patient has a normal mood and affect. behavior is normal. Judgment and thought content normal.   No results found for this or any previous visit (from the past 72 hour(s)).  PHQ2/9: Depression screen [facility] 2/9 09/08/2018 01/14/2018 12/30/2017 12/30/2017  Decreased Interest 0 _1 Down, Depressed, Hopeless 0 1 1 0  PHQ - 2 Score 0 _2 Altered sleeping 0 1 0 -  Tired, decreased energy _3 -  Change in appetite 0 0 0 -  Feeling bad or failure about yourself  0 0 1 -  Trouble concentrating _4 -  Moving slowly or fidgety/restless 0 0 0 -  Suicidal thoughts 0 0 0 -  PHQ-9 Score _5 -  Difficult doing work/chores Not difficult at all Somewhat difficult Very difficult -   GAD 7 : Generalized Anxiety Score 09/08/2018 01/14/2018 12/30/2017  Nervous, Anxious, on Edge _6 Control/stop worrying 0 1 3  Worry too much - different things 0 1 3  Trouble relaxing _7 Restless _8 Easily annoyed or irritable _9 Afraid - awful might happen 0 0 3  Total GAD 7 Score _10 Anxiety Difficulty Not difficult at all Somewhat difficult Somewhat difficult      Fall Risk: Fall Risk  09/08/2018 12/30/2017  Falls in the past year? No No    Functional  Status Survey: Is the patient deaf or have difficulty hearing?: No Does the patient have difficulty seeing, even when wearing glasses/contacts?: No Does the patient have difficulty concentrating, remembering, or making decisions?: No Does the patient have difficulty walking or climbing stairs?: No Does the patient have difficulty dressing or bathing?: No Does the patient have difficulty doing errands alone such as visiting a doctor's office or shopping?: No   Assessment & Plan  1. Generalized anxiety disorder Stable on current dose of lexapro, continue  this daily, contact office with any new symptoms. - escitalopram (LEXAPRO) 10 MG tablet; Take 1 tablet (10 mg total) by mouth daily.  Dispense: 90 tablet; Refill: 3  2. Mild episode of recurrent major depressive disorder ([place]) Denies new symptoms or worsening of depression, reports improved mood since taking lexapro - escitalopram (LEXAPRO) 10 MG tablet; Take 1 tablet (10 mg total) by mouth daily.  Dispense: 90 tablet; Refill: 3  3. Tobacco abuse counseling Discontinue using chewing tobacco while using this product. Do not use any nicotine products during use.  - Nicotine 21-14-7 MG/24HR KIT; Place 1 patch onto the skin daily.  Dispense: 1 each; Refill: 0  -Reviewed Health Maintenance - needs CPE - will schedule.

## 2018-10-21 DIAGNOSIS — M5414 Radiculopathy, thoracic region: Secondary | ICD-10-CM | POA: Diagnosis not present

## 2018-10-21 DIAGNOSIS — M9902 Segmental and somatic dysfunction of thoracic region: Secondary | ICD-10-CM | POA: Diagnosis not present

## 2018-10-21 DIAGNOSIS — M9901 Segmental and somatic dysfunction of cervical region: Secondary | ICD-10-CM | POA: Diagnosis not present

## 2018-10-21 DIAGNOSIS — R51 Headache: Secondary | ICD-10-CM | POA: Diagnosis not present

## 2018-11-29 DIAGNOSIS — S0502XA Injury of conjunctiva and corneal abrasion without foreign body, left eye, initial encounter: Secondary | ICD-10-CM | POA: Diagnosis not present

## 2019-03-11 ENCOUNTER — Encounter: Payer: BLUE CROSS/BLUE SHIELD | Admitting: Family Medicine

## 2019-04-22 ENCOUNTER — Ambulatory Visit (INDEPENDENT_AMBULATORY_CARE_PROVIDER_SITE_OTHER): Payer: BLUE CROSS/BLUE SHIELD | Admitting: Family Medicine

## 2019-04-22 ENCOUNTER — Encounter: Payer: Self-pay | Admitting: Family Medicine

## 2019-04-22 ENCOUNTER — Other Ambulatory Visit: Payer: Self-pay

## 2019-04-22 VITALS — BP 112/68 | HR 81 | Temp 98.8°F | Resp 16 | Ht 69.0 in | Wt 172.5 lb

## 2019-04-22 DIAGNOSIS — F330 Major depressive disorder, recurrent, mild: Secondary | ICD-10-CM

## 2019-04-22 DIAGNOSIS — L409 Psoriasis, unspecified: Secondary | ICD-10-CM

## 2019-04-22 DIAGNOSIS — Z716 Tobacco abuse counseling: Secondary | ICD-10-CM | POA: Diagnosis not present

## 2019-04-22 DIAGNOSIS — F411 Generalized anxiety disorder: Secondary | ICD-10-CM

## 2019-04-22 DIAGNOSIS — Z0000 Encounter for general adult medical examination without abnormal findings: Secondary | ICD-10-CM

## 2019-04-22 MED ORDER — NICOTINE 21-14-7 MG/24HR TD KIT: 1.0000 | PACK | Freq: Every day | TRANSDERMAL | 0 refills | Status: DC

## 2019-04-22 MED ORDER — TRIAMCINOLONE ACETONIDE 0.1 % EX CREA: TOPICAL_CREAM | CUTANEOUS | 2 refills | Status: DC

## 2019-04-22 MED ORDER — ESCITALOPRAM OXALATE 10 MG PO TABS: 10.0000 mg | ORAL_TABLET | Freq: Every day | ORAL | 3 refills | Status: DC

## 2019-04-22 NOTE — Progress Notes (Signed)
Name: Jeffery Gomez   MRN: [id number]    DOB: February 22, 1986   Date:04/22/2019       Progress Note  Subjective  Chief Complaint  Chief Complaint  Patient presents with  . Annual Exam    HPI  Patient presents for annual CPE.  USPSTF grade A and B recommendations:  Diet: Balanced, but states eating a bit more lately.  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  Depression: phq 9 is negative - anxiety has been very well controlled on lexapro, sleeping well. Depression screen [facility] 2/9 04/22/2019 09/08/2018 01/14/2018 12/30/2017 12/30/2017  Decreased Interest 0 0 _0 Down, Depressed, Hopeless 0 0 1 1 0  PHQ - 2 Score 0 0 _1 Altered sleeping 0 0 1 0 -  Tired, decreased energy 0 _2 -  Change in appetite 0 0 0 0 -  Feeling bad or failure about yourself  0 0 0 1 -  Trouble concentrating 0 _3 -  Moving slowly or fidgety/restless 0 0 0 0 -  Suicidal thoughts 0 0 0 0 -  PHQ-9 Score 0 _4 -  Difficult doing work/chores Not difficult at all Not difficult at all Somewhat difficult Very difficult -   Hypertension:  BP Readings from Last 3 Encounters:  04/22/19 112/68  09/08/18 128/70  01/14/18 108/62   Obesity: Wt Readings from Last 3 Encounters:  04/22/19 172 lb 8 oz (78.2 kg)  09/08/18 166 lb 9.6 oz (75.6 kg)  01/14/18 164 lb 1.6 oz (74.4 kg)   BMI Readings from Last 3 Encounters:  04/22/19 25.47 kg/m  09/08/18 24.60 kg/m  01/14/18 24.23 kg/m    Lipids:  No results found for: CHOL No results found for: HDL No results found for: LDLCALC No results found for: TRIG No results found for: CHOLHDL No results found for: LDLDIRECT Glucose:  Glucose  Date Value Ref Range Status  01/10/2012 102 (H) 65 - 99 mg/dL Final  01/09/2012 95 65 - 99 mg/dL Final   Glucose, Bld  Date Value Ref Range Status  12/30/2017 100 65 - 139 mg/dL Final    Comment:    .        Non-fasting reference interval .       Office  Visit from 04/22/2019 in [facility]  AUDIT-C Score  1     Married - wife is pregnant with a girl - due in November STD testing and prevention (HIV/chl/gon/syphilis): Declines today. Hep C: Declines labs today  Skin cancer: No concerning moles or lesions Colorectal cancer: Denies family or personal history of colorectal cancer, no changes in BM's - no blood in stool, dark and tarry stool, mucus in stool, or constipation/diarrhea. Prostate cancer: No family history that he knows of; no LUTS  Lung cancer/Tobacco abuse:  Smoking cigarettes every now and then, but he does use chewing tobacco - uses about 3/4-1 can a day.  Is seeing dentist tomorrow for check on oral health.  ECG:  No chest pain, shortness of breath, palpitations.  Advanced Care Planning: A voluntary discussion about advance care planning including the explanation and discussion of advance directives.  Discussed health care proxy and Living will, and the patient was able to identify a health care proxy as Wife - [place] Mam.  Patient does not have a living will at present time. If patient does have living will, I have  requested they bring this to the clinic to be scanned in to their chart.  Patient Active Problem List   Diagnosis Date Noted  . Generalized anxiety disorder 12/30/2017  . Mild episode of recurrent major depressive disorder ([place]) 12/30/2017    Past Surgical History:  Procedure Laterality Date  . NO PAST SURGERIES      Family History  Problem Relation Age of Onset  . Anxiety disorder Mother   . Panic disorder Mother   . Bipolar disorder Father   . Depression Brother   . Anxiety disorder Brother   . Heart attack Maternal Grandmother   . Alcohol abuse Maternal Grandfather   . Cirrhosis Maternal Grandfather   . Thyroid disease Paternal Grandmother     Social History   Socioeconomic History  . Marital status: Married    Spouse name: Chelsie  . Number of children: 3  . Years of  education: Not on file  . Highest education level: Not on file  Occupational History  . Not on file  Social Needs  . Financial resource strain: Not hard at all  . Food insecurity:    Worry: Never true    Inability: Never true  . Transportation needs:    Medical: No    Non-medical: No  Tobacco Use  . Smoking status: Current Some Day Smoker    Types: Cigarettes  . Smokeless tobacco: Current User    Types: Chew  Substance and Sexual Activity  . Alcohol use: Yes    Comment: occasinal  . Drug use: No  . Sexual activity: Yes    Partners: Female  Lifestyle  . Physical activity:    Days per week: 0 days    Minutes per session: 0 min  . Stress: Only a little  Relationships  . Social connections:    Talks on phone: More than three times a week    Gets together: More than three times a week    Attends religious service: Never    Active member of club or organization: No    Attends meetings of clubs or organizations: Never    Relationship status: Married  . Intimate partner violence:    Fear of current or ex partner: No    Emotionally abused: No    Physically abused: No    Forced sexual activity: No  Other Topics Concern  . Not on file  Social History Narrative  . Not on file     Current Outpatient Medications:  .  escitalopram (LEXAPRO) 10 MG tablet, Take 1 tablet (10 mg total) by mouth daily., Disp: 90 tablet, Rfl: 3 .  Nicotine 21-14-7 MG/24HR KIT, Place 1 patch onto the skin daily. (Patient not taking: Reported on 04/22/2019), Disp: 1 each, Rfl: 0  Allergies  Allergen Reactions  . Depakote [Divalproex Sodium] Anaphylaxis     ROS  Constitutional: Negative for fever or weight change.  Respiratory: Negative for cough and shortness of breath.   Cardiovascular: Negative for chest pain or palpitations.  Gastrointestinal: Negative for abdominal pain, no bowel changes.  Musculoskeletal: Negative for gait problem or joint swelling.  Skin: Notes plaques on the right knee  and right albow for about 5 years - used to use steroid cream which helped, but he ran out of the medication. Neurological: Negative for dizziness or headache.  No other specific complaints in a complete review of systems (except as listed in HPI above).  Objective  Vitals:   04/22/19 1257  BP: 112/68  Pulse: 81  Resp:  16  Temp: 98.8 F (37.1 C)  TempSrc: Oral  SpO2: 96%  Weight: 172 lb 8 oz (78.2 kg)  Height: 5' 9" (1.753 m)    Body mass index is 25.47 kg/m.  Physical Exam Constitutional: Patient appears well-developed and well-nourished. No distress.  HENT: Head: Normocephalic and atraumatic. Ears: B TMs ok, no erythema or effusion; Nose: Nose normal. Mouth/Throat: Oropharynx is clear and moist. No oropharyngeal exudate.  Eyes: Conjunctivae and EOM are normal. Pupils are equal, round, and reactive to light. No scleral icterus.  Neck: Normal range of motion. Neck supple. No JVD present. No thyromegaly present.  Cardiovascular: Normal rate, regular rhythm and normal heart sounds.  No murmur heard. No BLE edema. Pulmonary/Chest: Effort normal and breath sounds normal. No respiratory distress. Abdominal: Soft. Bowel sounds are normal, no distension. There is no tenderness. no masses Musculoskeletal: Normal range of motion, no joint effusions. No gross deformities Neurological: he is alert and oriented to person, place, and time. No cranial nerve deficit. Coordination, balance, strength, speech and gait are normal.  Skin: There is a silvery plaque to the right knee and small plaque to the right posterior elbow - both have mild underlying erythema. Psychiatric: Patient has a normal mood and affect. behavior is normal. Judgment and thought content normal.  No results found for this or any previous visit (from the past 2160 hour(s)).   PHQ2/9: Depression screen [facility] 2/9 04/22/2019 09/08/2018 01/14/2018 12/30/2017 12/30/2017  Decreased Interest 0 0 _0 Down, Depressed, Hopeless 0 0 1 1  0  PHQ - 2 Score 0 0 _1 Altered sleeping 0 0 1 0 -  Tired, decreased energy 0 _2 -  Change in appetite 0 0 0 0 -  Feeling bad or failure about yourself  0 0 0 1 -  Trouble concentrating 0 _3 -  Moving slowly or fidgety/restless 0 0 0 0 -  Suicidal thoughts 0 0 0 0 -  PHQ-9 Score 0 _4 -  Difficult doing work/chores Not difficult at all Not difficult at all Somewhat difficult Very difficult -    Fall Risk: Fall Risk  04/22/2019 09/08/2018 12/30/2017  Falls in the past year? 0 No No  Number falls in past yr: 0 - -  Injury with Fall? 0 - -  Follow up Falls evaluation completed - -    Assessment & Plan  1. Encounter for annual physical exam -Prostate cancer screening and PSA options (with potential risks and benefits of testing vs not testing) were discussed along with recent recs/guidelines. -USPSTF grade A and B recommendations reviewed with patient; age-appropriate recommendations, preventive care, screening tests, etc discussed and encouraged; healthy living encouraged; see AVS for patient education given to patient -Discussed importance of 150 minutes of physical activity weekly, eat two servings of fish weekly, eat one serving of tree nuts ( cashews, pistachios, pecans, almonds.Marland Kitchen) every other day, eat 6 servings of fruit/vegetables daily and drink plenty of water and avoid sweet beverages.   2. Generalized anxiety disorder - escitalopram (LEXAPRO) 10 MG tablet; Take 1 tablet (10 mg total) by mouth daily.  Dispense: 90 tablet; Refill: 3  3. Mild episode of recurrent major depressive disorder (HCC) - escitalopram (LEXAPRO) 10 MG tablet; Take 1 tablet (10 mg total) by mouth daily.  Dispense: 90 tablet; Refill: 3  4. Tobacco abuse counseling - Nicotine 21-14-7 MG/24HR KIT; Place 1 patch onto the skin daily.  Dispense: 1 each; Refill: 0  5. Psoriasis - triamcinolone cream (KENALOG) 0.1 %; Apply to affected area twice daily as needed up to 2 weeks at a time.  Too strong for  face, genitals, armpits, or breasts.  Dispense: 30 g; Refill: 2

## 2020-05-08 DIAGNOSIS — L400 Psoriasis vulgaris: Secondary | ICD-10-CM | POA: Diagnosis not present

## 2020-06-14 ENCOUNTER — Telehealth: Payer: Self-pay

## 2020-06-14 NOTE — Telephone Encounter (Signed)
Pt needs appt for refills

## 2020-06-14 NOTE — Telephone Encounter (Signed)
appt schedule with Dr Dorris Fetch for Jun 19, 2020

## 2020-06-18 NOTE — Progress Notes (Signed)
Patient ID: Jeffery Gomez, male    DOB: [birth year], 34 y.o.   MRN: [id number]  PCP: Jeffery Haring, MD  Chief Complaint  Patient presents with  . Follow-up    medciation refill, Lexapro    Subjective:   Jeffery Gomez is a 34 y.o. male, presents to clinic with CC of the following:  Chief Complaint  Patient presents with  . Follow-up    medciation refill, Lexapro    HPI:  Patient is a 34 year old male patient of Jeffery Gomez Last visit at [facility] was in June 2020 for an annual physical. Last acute care visit was 09/08/2018. He follows up today as he was in need of medication refills. He notes that all in all he has been feeling good.  Noted he needs to try to exercise more to help keep his weight control.  generalized anxiety disorder and depression.  Med Regimen -  Lexapro 10mg  daily, noted significant improvement after starting in February 2019.  He notes that he continues to get benefit with the Lexapro, and can tell when he does not take it for a week or 2 that it is helpful. Helps with his mood, overall energy levels. His PHQ-9 and GAD-7 were reviewed today.   Tobacco Abuse -he used to smoke, and then use chewing tobacco did quit cigarette use, and then was able to quit the chewing tobacco.  After getting married, he resumed again, continues to chew, and smokes at least a cigarette a day again.  He was recommended to try nicotine supplements to help previously.  He notes he can get those over-the-counter now. Strongly encouraged him today to try to lessen and then eventually quit tobacco use products and the benefits of that.  Psoriasis With a couple plaques noted on his exam a year ago, and the elbow and knee, triamcinolone cream (KENALOG) 0.1 % was prescribed; he noted he went to dermatology, and they gave him a stronger topical product, and has significantly helped.  He just has some faint residual on the elbow.  Last labs-metabolic panel, CBC, and  TSH were normal in February 2019.  Patient Active Problem List   Diagnosis Date Noted  . Generalized anxiety disorder 12/30/2017  . Mild episode of recurrent major depressive disorder (HCC) 12/30/2017      Current Outpatient Medications:  .  escitalopram (LEXAPRO) 10 MG tablet, Take 1 tablet (10 mg total) by mouth daily., Disp: 90 tablet, Rfl: 3   Allergies  Allergen Reactions  . Depakote [Divalproex Sodium] Anaphylaxis     Past Surgical History:  Procedure Laterality Date  . NO PAST SURGERIES       Family History  Problem Relation Age of Onset  . Anxiety disorder Mother   . Panic disorder Mother   . Bipolar disorder Father   . Depression Brother   . Anxiety disorder Brother   . Heart attack Maternal Grandmother   . Alcohol abuse Maternal Grandfather   . Cirrhosis Maternal Grandfather   . Thyroid disease Paternal Grandmother      Social History   Tobacco Use  . Smoking status: Current Some Day Smoker    Types: Cigarettes  . Smokeless tobacco: Current User    Types: Chew  Substance Use Topics  . Alcohol use: Yes    Comment: occasinal    With staff assistance, above reviewed with the patient today.  ROS: As per HPI, otherwise no specific complaints on a limited and focused system review  No results found for this or any previous visit (from the past 72 hour(s)).   PHQ2/9: Depression screen [facility] 2/9 06/19/2020 04/22/2019 09/08/2018 01/14/2018 12/30/2017  Decreased Interest 0 0 0 1 1  Down, Depressed, Hopeless 0 0 0 1 1  PHQ - 2 Score 0 0 0 2 2  Altered sleeping 0 0 0 1 0  Tired, decreased energy 1 0 1 1 3   Change in appetite 1 0 0 0 0  Feeling bad or failure about yourself  0 0 0 0 1  Trouble concentrating 1 0 1 1 3   Moving slowly or fidgety/restless 1 0 0 0 0  Suicidal thoughts 0 0 0 0 0  PHQ-9 Score 4 0 2 5 9   Difficult doing work/chores Not difficult at all Not difficult at all Not difficult at all Somewhat difficult Very difficult   PHQ-2/9 Result  reviewed  GAD 7 : Generalized Anxiety Score 06/19/2020 09/08/2018 01/14/2018 12/30/2017  Nervous, Anxious, on Edge 1 1 2 3   Control/stop worrying 0 0 1 3  Worry too much - different things 0 0 1 3  Trouble relaxing 1 1 1 2   Restless 1 1 2 3   Easily annoyed or irritable 1 1 1 3   Afraid - awful might happen 0 0 0 3  Total GAD 7 Score 4 4 8 20   Anxiety Difficulty Somewhat difficult Not difficult at all Somewhat difficult Somewhat difficult   Result reviewed   Fall Risk: Fall Risk  06/19/2020 04/22/2019 09/08/2018 12/30/2017  Falls in the past year? 0 0 No No  Number falls in past yr: 0 0 - -  Injury with Fall? 0 0 - -  Follow up - Falls evaluation completed - -      Objective:   Vitals:   06/19/20 1432  BP: 110/68  Pulse: 86  Resp: 16  Temp: 98.7 F (37.1 C)  TempSrc: Temporal  SpO2: 99%  Weight: 179 lb 6.4 oz (81.4 kg)  Height: 5\' 9"  (1.753 m)    Body mass index is 26.49 kg/m.  Physical Exam   NAD, masked, pleasant HEENT - [place]/AT, sclera anicteric, PERRL, EOMI, conj - non-inj'ed,  pharynx clear Neck - supple, no adenopathy,  Car - RRR without m/g/r Pulm- RR and effort normal at rest, CTA without wheeze or rales Abd - soft, NT diffusely, ND,  Skin-no marked plaque concerns presently, some mild residual of psoriasis on the elbow area noted today. Ext - no LE edema,  Neuro/psychiatric - affect was not flat, appropriate with conversation  Alert  Grossly non-focal   Speech  normal   Results for orders placed or performed in visit on 12/30/17  BASIC METABOLIC PANEL WITH GFR  Result Value Ref Range   Glucose, Bld 100 65 - 139 mg/dL   BUN 15 7 - 25 mg/dL   Creat  - 08/19/2020 mg/dL   GFR, Est Non African American 116 > OR = 60 mL/min/1.35m2   GFR, Est African American 134 > OR = 60 mL/min/1.80m2   BUN/Creatinine Ratio NOT APPLICABLE 6 - 22 (calc)   Sodium 137 135 - 146 mmol/L   Potassium 4.7 3.5 - 5.3 mmol/L   Chloride 104 98 - 110 mmol/L   CO2 28 20 - 32 mmol/L    Calcium 10.1 8.6 - 10.3 mg/dL  TSH  Result Value Ref Range   TSH 1.03 0.40 - 4.50 mIU/L  CBC  Result Value Ref Range   WBC 7.1 3.8 - 10.8 Thousand/uL  RBC 5.36 4.20 - 5.80 Million/uL   Hemoglobin 15.6 13.2 - 17.1 g/dL   HCT 75.9 38 - 50 %   MCV 84.0 80.0 - 100.0 fL   MCH 29.1 27.0 - 33.0 pg   MCHC 34.7 32.0 - 36.0 g/dL   RDW 16.3 84.6 - 65.9 %   Platelets 330 140 - 400 Thousand/uL   MPV 9.4 7.5 - 12.5 fL       Assessment & Plan:  1. Generalized anxiety disorder 2. Mild episode of recurrent major depressive disorder [facility]) Patient has done very well on the Lexapro product-10 mg daily, and will continue presently. Refilled that for him today. He has not had counseling involved in the past, and he does not feel like it is needed at this time. - escitalopram (LEXAPRO) 10 MG tablet; Take 1 tablet (10 mg total) by mouth daily.  Dispense: 90 tablet; Refill: 3  3. Tobacco use Strongly encouraged complete tobacco cessation over time.  4. Psoriasis Saw Derm, and symptoms much improved with the topical entity provided by Derm.  Strongly encourage some form of regular exercise in the future, to help with healthy weight maintenance over time. Tentatively schedule a follow-up in 1 years time, will follow-up sooner as needed.      Jeffery Haring, MD 06/19/20 2:39 PM

## 2020-06-19 ENCOUNTER — Encounter: Payer: Self-pay | Admitting: Internal Medicine

## 2020-06-19 ENCOUNTER — Ambulatory Visit (INDEPENDENT_AMBULATORY_CARE_PROVIDER_SITE_OTHER): Payer: BC Managed Care – PPO | Admitting: Internal Medicine

## 2020-06-19 ENCOUNTER — Other Ambulatory Visit: Payer: Self-pay

## 2020-06-19 VITALS — BP 110/68 | HR 86 | Temp 98.7°F | Resp 16 | Ht 69.0 in | Wt 179.4 lb

## 2020-06-19 DIAGNOSIS — F17200 Nicotine dependence, unspecified, uncomplicated: Secondary | ICD-10-CM | POA: Insufficient documentation

## 2020-06-19 DIAGNOSIS — F411 Generalized anxiety disorder: Secondary | ICD-10-CM

## 2020-06-19 DIAGNOSIS — F330 Major depressive disorder, recurrent, mild: Secondary | ICD-10-CM

## 2020-06-19 DIAGNOSIS — L409 Psoriasis, unspecified: Secondary | ICD-10-CM | POA: Insufficient documentation

## 2020-06-19 DIAGNOSIS — Z720 Tobacco use: Secondary | ICD-10-CM | POA: Insufficient documentation

## 2020-06-19 MED ORDER — ESCITALOPRAM OXALATE 10 MG PO TABS: 10.0000 mg | ORAL_TABLET | Freq: Every day | ORAL | 3 refills | Status: DC

## 2020-06-19 NOTE — Patient Instructions (Signed)
Strongly encouraged to get more physical activity  Also strongly encouraged to try to lessen and eventually quit all tobacco products.  Refill the Lexapro product to continue as well.

## 2021-01-08 ENCOUNTER — Ambulatory Visit: Payer: BC Managed Care – PPO | Admitting: Family Medicine

## 2021-01-08 ENCOUNTER — Encounter: Payer: Self-pay | Admitting: Family Medicine

## 2021-01-08 ENCOUNTER — Other Ambulatory Visit: Payer: Self-pay

## 2021-01-08 DIAGNOSIS — F411 Generalized anxiety disorder: Secondary | ICD-10-CM

## 2021-01-08 DIAGNOSIS — F330 Major depressive disorder, recurrent, mild: Secondary | ICD-10-CM

## 2021-01-08 MED ORDER — ESCITALOPRAM OXALATE 20 MG PO TABS: 20.0000 mg | ORAL_TABLET | Freq: Every day | ORAL | 1 refills | Status: DC

## 2021-01-08 NOTE — Patient Instructions (Signed)
It was great to see you!  Our plans for today:  - We are increasing your lexapro (20mg ). Let know if you don't tolerate this.  - Follow up in 4 weeks.   Take care and seek immediate care sooner if you develop any concerns.   Dr. [place]

## 2021-01-08 NOTE — Progress Notes (Signed)
   SUBJECTIVE:   CHIEF COMPLAINT / HPI:   Patient Active Problem List   Diagnosis Date Noted  . Tobacco use 06/19/2020  . Psoriasis 06/19/2020  . Generalized anxiety disorder 12/30/2017  . Mild episode of recurrent major depressive disorder (HCC) 12/30/2017   Anxiety/Depression - Medications: lexapro 10mg  - Taking: missed two days in the last month. - Counseling: no - Previous hospitalizations: yes, when younger (~15, 35yo) for ?bipolar episode triggered by alcohol. - FH of psych illness: mom with panic attacks, ?brother, dad with bipolar/?schizophrenic - Symptoms: hard to focus, irritable, fatigue, fidgety. Biting nails, tapping feet and hands.  - Current stressors: work stress, 34yo. - Coping Mechanisms: tobacco, nail biting - has wondered if he has ADHD, never been diagnosed but with difficulty concentrating, has been a concern.  Depression screen [facility] 2/9 01/08/2021 06/19/2020 04/22/2019  Decreased Interest 2 0 0  Down, Depressed, Hopeless 1 0 0  PHQ - 2 Score 3 0 0  Altered sleeping 0 0 0  Tired, decreased energy 3 1 0  Change in appetite 0 1 0  Feeling bad or failure about yourself  1 0 0  Trouble concentrating 3 1 0  Moving slowly or fidgety/restless 3 1 0  Suicidal thoughts 0 0 0  PHQ-9 Score 13 4 0  Difficult doing work/chores Somewhat difficult Not difficult at all Not difficult at all   GAD 7 : Generalized Anxiety Score 01/08/2021 06/19/2020 09/08/2018 01/14/2018  Nervous, Anxious, on Edge 3 1 1 2   Control/stop worrying 2 0 0 1  Worry too much - different things 3 0 0 1  Trouble relaxing 3 1 1 1   Restless 3 1 1 2   Easily annoyed or irritable 3 1 1 1   Afraid - awful might happen 2 0 0 0  Total GAD 7 Score 19 4 4 8   Anxiety Difficulty Very difficult Somewhat difficult Not difficult at all Somewhat difficult    OBJECTIVE:   BP 120/80   Pulse 86   Temp 98.4 F (36.9 C) (Oral)   Resp 16   Ht 5\' 9"  (1.753 m)   Wt 182 lb 3.2 oz (82.6 kg)   SpO2 95%    BMI 26.91 kg/m   Gen: well appearing, in NAD Psych: appropriately dressed and groomed. No tangential speech or thought process. Speech nonpressured. Occasionally fidgety.  ASSESSMENT/PLAN:   Generalized anxiety disorder Uncontrolled, GAD 19 today. Much discussion had regarding personal and family history and concern for ADHD. Offered psych referral for further evaluation but pt elected to start with lexapro increase, rx sent to pharmacy.  Coping mechanisms reviewed. F/u in 4 weeks.   Mild episode of recurrent major depressive disorder (HCC) Uncontrolled, PHQ 13 today. No SI. Much discussion had regarding personal and family history and concern for ADHD. Offered psych referral for further evaluation but pt elected to start with lexapro increase, rx sent to pharmacy.  Coping mechanisms reviewed. F/u in 4 weeks.    01/16/2018, DO

## 2021-01-08 NOTE — Assessment & Plan Note (Signed)
Uncontrolled, GAD 19 today. Much discussion had regarding personal and family history and concern for ADHD. Offered psych referral for further evaluation but pt elected to start with lexapro increase, rx sent to pharmacy.  Coping mechanisms reviewed. F/u in 4 weeks.

## 2021-01-08 NOTE — Assessment & Plan Note (Addendum)
Uncontrolled, PHQ 13 today. No SI. Much discussion had regarding personal and family history and concern for ADHD. Offered psych referral for further evaluation but pt elected to start with lexapro increase, rx sent to pharmacy.  Coping mechanisms reviewed. F/u in 4 weeks.

## 2021-01-30 ENCOUNTER — Ambulatory Visit: Payer: BC Managed Care – PPO | Admitting: Family Medicine

## 2021-01-30 ENCOUNTER — Encounter: Payer: Self-pay | Admitting: Family Medicine

## 2021-01-30 ENCOUNTER — Other Ambulatory Visit: Payer: Self-pay

## 2021-01-30 VITALS — BP 110/70 | HR 95 | Temp 98.5°F | Resp 16 | Ht 69.0 in | Wt 180.1 lb

## 2021-01-30 DIAGNOSIS — F411 Generalized anxiety disorder: Secondary | ICD-10-CM | POA: Diagnosis not present

## 2021-01-30 DIAGNOSIS — F330 Major depressive disorder, recurrent, mild: Secondary | ICD-10-CM

## 2021-01-30 MED ORDER — BUSPIRONE HCL 7.5 MG PO TABS: 7.5000 mg | ORAL_TABLET | Freq: Two times a day (BID) | ORAL | 1 refills | Status: DC

## 2021-01-30 NOTE — Progress Notes (Signed)
    SUBJECTIVE:   CHIEF COMPLAINT / HPI:   Anxiety/Depression - Medications: lexapro 20mg  daily - Taking: good - Counseling: no - Previous hospitalizations: yes, when younger (~15, 35yo) for ?bipolar episode triggered by alcohol. - FH of psych illness: mom with panic attacks, ?brother, dad with bipolar/?schizophrenic - Symptoms: still feeling on the edge, lack of energy, biting nails, can't sit still - Current stressors: work  Depression screen [facility] 2/9 01/30/2021 01/08/2021 06/19/2020  Decreased Interest 1 2 0  Down, Depressed, Hopeless 1 1 0  PHQ - 2 Score 2 3 0  Altered sleeping 1 0 0  Tired, decreased energy 2 3 1   Change in appetite 0 0 1  Feeling bad or failure about yourself  1 1 0  Trouble concentrating 1 3 1   Moving slowly or fidgety/restless 1 3 1   Suicidal thoughts 0 0 0  PHQ-9 Score 8 13 4   Difficult doing work/chores Somewhat difficult Somewhat difficult Not difficult at all   GAD 7 : Generalized Anxiety Score 01/30/2021 01/08/2021 06/19/2020 09/08/2018  Nervous, Anxious, on Edge 2 3 1 1   Control/stop worrying 1 2 0 0  Worry too much - different things 1 3 0 0  Trouble relaxing 3 3 1 1   Restless 3 3 1 1   Easily annoyed or irritable 2 3 1 1   Afraid - awful might happen 1 2 0 0  Total GAD 7 Score 13 19 4 4   Anxiety Difficulty Somewhat difficult Very difficult Somewhat difficult Not difficult at all      OBJECTIVE:   BP 110/70   Pulse 95   Temp 98.5 F (36.9 C) (Oral)   Resp 16   Ht 5\' 9"  (1.753 m)   Wt 180 lb 1.6 oz (81.7 kg)   SpO2 98%   BMI 26.60 kg/m   Gen: well appearing, in NAD Card: Reg rate Lungs: comfortable WOB on RA Ext: WWP Psych: mood and affect appropriate, fidgety.   ASSESSMENT/PLAN:   Generalized anxiety disorder Slightly improved but still not well controlled. Will add buspar, can increase to 15mg  BID after 1-2 weeks if needing more symptom control. Again discussed counseling but declined. F/u in 3 months or sooner if need arises. If  not well controlled on f/u, could consider psych referral given FH.   Mild episode of recurrent major depressive disorder (HCC) Improved, now having more anxious sx. See plan above.    , DO

## 2021-01-30 NOTE — Patient Instructions (Signed)
It was great to see you!  Our plans for today:  - We are starting a new medication, buspirone. Take this twice daily. If you still feel like you need a bit more control of your symptoms after about 1-2 weeks, you can double your dose to 15mg  twice daily. - Let know if you change your mind about counseling.  - come back in 3 months or sooner if you need [place].   Take care and seek immediate care sooner if you develop any concerns.   Dr. [place]

## 2021-01-30 NOTE — Assessment & Plan Note (Signed)
Improved, now having more anxious sx. See plan above.

## 2021-01-30 NOTE — Assessment & Plan Note (Signed)
Slightly improved but still not well controlled. Will add buspar, can increase to 15mg  BID after 1-2 weeks if needing more symptom control. Again discussed counseling but declined. F/u in 3 months or sooner if need arises. If not well controlled on f/u, could consider psych referral given FH.

## 2021-03-28 ENCOUNTER — Other Ambulatory Visit: Payer: Self-pay | Admitting: Family Medicine

## 2021-03-28 NOTE — Telephone Encounter (Signed)
Requested Prescriptions  Pending Prescriptions Disp Refills  . busPIRone (BUSPAR) 7.5 MG tablet [Pharmacy Med Name: BUSPIRONE HCL 7.5 MG TABLET] 120 tablet 2    Sig: TAKE 1 TABLET (7.5 MG TOTAL) BY MOUTH 2 (TWO) TIMES DAILY. MAY INCREASE TO 2 TABLETS (15MG ) TWICE DAILY IF NEEDED AFTER 1-2 WEEKS.     Psychiatry: Anxiolytics/Hypnotics - Non-controlled Passed - 03/28/2021  6:53 PM      Passed - Valid encounter within last 6 months    Recent Outpatient Visits          1 month ago Generalized anxiety disorder   [facility], DO   2 months ago Generalized anxiety disorder   [facility], DO   9 months ago Tobacco use   [facility], MD   1 year ago Encounter for annual physical exam   [facility], FNP   2 years ago Generalized anxiety disorder   [facility], Doren Custard

## 2021-06-20 ENCOUNTER — Ambulatory Visit: Payer: BC Managed Care – PPO | Admitting: Internal Medicine

## 2021-07-01 ENCOUNTER — Other Ambulatory Visit: Payer: Self-pay | Admitting: Family Medicine

## 2021-07-01 DIAGNOSIS — F330 Major depressive disorder, recurrent, mild: Secondary | ICD-10-CM

## 2021-07-01 DIAGNOSIS — F411 Generalized anxiety disorder: Secondary | ICD-10-CM

## 2021-07-01 NOTE — Telephone Encounter (Signed)
Requested Prescriptions  Pending Prescriptions Disp Refills  . escitalopram (LEXAPRO) 20 MG tablet [Pharmacy Med Name: ESCITALOPRAM 20 MG TABLET] 90 tablet 0    Sig: TAKE 1 TABLET BY MOUTH EVERY DAY     Psychiatry:  Antidepressants - SSRI Passed - 07/01/2021 11:19 AM      Passed - Completed PHQ-2 or PHQ-9 in the last 360 days      Passed - Valid encounter within last 6 months    Recent Outpatient Visits          5 months ago Generalized anxiety disorder   [facility] Caro Laroche, DO   5 months ago Generalized anxiety disorder   [facility] Caro Laroche, DO   1 year ago Tobacco use   [facility] Jamelle Haring, MD   2 years ago Encounter for annual physical exam   [facility] Doren Custard, FNP   2 years ago Generalized anxiety disorder   [facility] [place], Gerome Apley, [place]

## 2021-08-28 DIAGNOSIS — J019 Acute sinusitis, unspecified: Secondary | ICD-10-CM | POA: Diagnosis not present

## 2021-09-26 ENCOUNTER — Other Ambulatory Visit: Payer: Self-pay | Admitting: Family Medicine

## 2021-09-26 ENCOUNTER — Telehealth: Payer: Self-pay | Admitting: *Deleted

## 2021-09-26 DIAGNOSIS — F330 Major depressive disorder, recurrent, mild: Secondary | ICD-10-CM

## 2021-09-26 DIAGNOSIS — F411 Generalized anxiety disorder: Secondary | ICD-10-CM

## 2021-09-26 NOTE — Telephone Encounter (Signed)
Called pt and made him an appt for his 6 month check and refills with Dr. Linwood Dibbles for 10/02/2021 at 8:20. I refilled his Lexapro 20 mg for #90, 0 refills.

## 2021-09-26 NOTE — Telephone Encounter (Signed)
Called pt and made him an appt with Dr. Linwood Dibbles for 10/02/2021 at 8:20.  Requested Prescriptions  Pending Prescriptions Disp Refills  . escitalopram (LEXAPRO) 20 MG tablet [Pharmacy Med Name: ESCITALOPRAM 20 MG TABLET] 90 tablet 0    Sig: TAKE 1 TABLET BY MOUTH EVERY DAY     Psychiatry:  Antidepressants - SSRI Failed - 09/26/2021  3:02 AM      Failed - Valid encounter within last 6 months    Recent Outpatient Visits          7 months ago Generalized anxiety disorder   [facility] Caro Laroche, DO   8 months ago Generalized anxiety disorder   [facility] Caro Laroche, DO   1 year ago Tobacco use   [facility] Jamelle Haring, MD   2 years ago Encounter for annual physical exam   [facility] Doren Custard, FNP   3 years ago Generalized anxiety disorder   [facility] Doren Custard, FNP      Future Appointments            In 6 days [place], Darl Householder, DO [facility], [facility]           Passed - Completed PHQ-2 or PHQ-9 in the last 360 days

## 2021-10-02 ENCOUNTER — Encounter: Payer: Self-pay | Admitting: Family Medicine

## 2021-10-02 ENCOUNTER — Other Ambulatory Visit: Payer: Self-pay

## 2021-10-02 ENCOUNTER — Ambulatory Visit: Payer: BC Managed Care – PPO | Admitting: Family Medicine

## 2021-10-02 VITALS — BP 112/66 | HR 94 | Temp 97.8°F | Resp 16 | Ht 69.0 in | Wt 183.0 lb

## 2021-10-02 DIAGNOSIS — F411 Generalized anxiety disorder: Secondary | ICD-10-CM | POA: Diagnosis not present

## 2021-10-02 DIAGNOSIS — R0683 Snoring: Secondary | ICD-10-CM | POA: Diagnosis not present

## 2021-10-02 MED ORDER — BUSPIRONE HCL 7.5 MG PO TABS: 7.5000 mg | ORAL_TABLET | Freq: Two times a day (BID) | ORAL | 2 refills | Status: DC | PRN

## 2021-10-02 NOTE — Assessment & Plan Note (Signed)
Not well controlled but is forgetting some doses of lexapro, otherwise tolerating it well. Patient to try modifications to help remember to take consistently. Ok to continue to use buspar prn. Also with concern for undiagnosed OSA contributing, will obtain sleep study. F/u in 2 months.

## 2021-10-02 NOTE — Progress Notes (Signed)
   SUBJECTIVE:   CHIEF COMPLAINT / HPI:   Anxiety - Medications: lexapro, buspar - Taking: maybe missing 4 doses of lexapro per month. Taking buspar seldomly. - Counseling: no - Previous hospitalizations: yes, when younger (~15, 35yo) for ?bipolar episode triggered by alcohol. - FH of psych illness: mom with panic attacks, ?brother, dad with bipolar/?schizophrenic - Symptoms: easily irritable  - Current stressors: work - Coping Mechanisms: watching TV in the evenings - snoring at night. Unknown apneic events. Does not wake feeling refreshed. Sometimes has morning headaches.   GAD 7 : Generalized Anxiety Score 10/02/2021 01/30/2021 01/08/2021 06/19/2020  Nervous, Anxious, on Edge 2 2 3 1   Control/stop worrying 2 1 2  0  Worry too much - different things 2 1 3  0  Trouble relaxing 2 3 3 1   Restless 2 3 3 1   Easily annoyed or irritable 2 2 3 1   Afraid - awful might happen 1 1 2  0  Total GAD 7 Score 13 13 19 4   Anxiety Difficulty Somewhat difficult Somewhat difficult Very difficult Somewhat difficult    Depression screen [facility] 2/9 10/02/2021 01/30/2021 01/08/2021  Decreased Interest 0 1 2  Down, Depressed, Hopeless 0 1 1  PHQ - 2 Score 0 2 3  Altered sleeping 0 1 0  Tired, decreased energy 1 2 3   Change in appetite 0 0 0  Feeling bad or failure about yourself  0 1 1  Trouble concentrating 0 1 3  Moving slowly or fidgety/restless 3 1 3   Suicidal thoughts 0 0 0  PHQ-9 Score 4 8 13   Difficult doing work/chores Somewhat difficult Somewhat difficult Somewhat difficult    OBJECTIVE:   BP 112/66   Pulse 94   Temp 97.8 F (36.6 C)   Resp 16   Ht 5\' 9"  (1.753 m)   Wt 183 lb (83 kg)   SpO2 98%   BMI 27.02 kg/m   Gen: well appearing, in NAD Psych: pleasant, mood and affect appropriate  ASSESSMENT/PLAN:   Generalized anxiety disorder Not well controlled but is forgetting some doses of lexapro, otherwise tolerating it well. Patient to try modifications to help remember to take  consistently. Ok to continue to use buspar prn. Also with concern for undiagnosed OSA contributing, will obtain sleep study. F/u in 2 months.      , DO

## 2021-10-02 NOTE — Patient Instructions (Signed)
It was great to see you!  Our plans for today:  - Someone will call you about setting up the sleep study.  - We will keep your medications the same for now. - Come back in 2 months.   Take care and seek immediate care sooner if you develop any concerns.   Dr. Linwood Dibbles

## 2022-03-17 ENCOUNTER — Other Ambulatory Visit: Payer: Self-pay | Admitting: Family Medicine

## 2022-03-17 DIAGNOSIS — F411 Generalized anxiety disorder: Secondary | ICD-10-CM

## 2022-03-17 DIAGNOSIS — F330 Major depressive disorder, recurrent, mild: Secondary | ICD-10-CM

## 2022-03-19 ENCOUNTER — Ambulatory Visit: Payer: BC Managed Care – PPO | Admitting: Nurse Practitioner

## 2022-03-19 ENCOUNTER — Encounter: Payer: Self-pay | Admitting: Nurse Practitioner

## 2022-03-19 ENCOUNTER — Other Ambulatory Visit: Payer: Self-pay

## 2022-03-19 VITALS — BP 120/80 | HR 84 | Temp 98.2°F | Resp 18 | Ht 69.0 in | Wt 175.8 lb

## 2022-03-19 DIAGNOSIS — F331 Major depressive disorder, recurrent, moderate: Secondary | ICD-10-CM

## 2022-03-19 DIAGNOSIS — F411 Generalized anxiety disorder: Secondary | ICD-10-CM

## 2022-03-19 DIAGNOSIS — R0683 Snoring: Secondary | ICD-10-CM | POA: Insufficient documentation

## 2022-03-19 DIAGNOSIS — Z818 Family history of other mental and behavioral disorders: Secondary | ICD-10-CM

## 2022-03-19 DIAGNOSIS — R41840 Attention and concentration deficit: Secondary | ICD-10-CM

## 2022-03-19 DIAGNOSIS — R5383 Other fatigue: Secondary | ICD-10-CM | POA: Diagnosis not present

## 2022-03-19 MED ORDER — ESCITALOPRAM OXALATE 20 MG PO TABS: 20.0000 mg | ORAL_TABLET | Freq: Every day | ORAL | 1 refills | Status: DC

## 2022-03-19 NOTE — Progress Notes (Addendum)
? ?BP 120/80   Pulse 84   Temp 98.2 ?F (36.8 ?C) (Oral)   Resp 18   Ht 5\' 9"  (1.753 m)   Wt 175 lb 12.8 oz (79.7 kg)   SpO2 98%   BMI 25.96 kg/m?   ? ?Subjective:  ? ? Patient ID: Jeffery Gomez, male    DOB: [birth year], 36 y.o.   MRN: [id number] ? ?HPI: ?OSBORN PULLIN is a 36 y.o. male, here alone ? ?Chief Complaint  ?Patient presents with  ? Anxiety  ?  Medication refill  ? Depression  ? ?Anxiety and Depression: He says he is not taking the BuSpar he does not like how it makes him feel.  He says he ran out of his Lexapro a while ago.  He did notice a difference when he was taking it.  His PHQ-9 and GAD scores are very high.  He denies any suicidal thoughts.  Does have a family history of bipolar disorder.  He did have a questionable bipolar episode triggered by alcohol when he was about 36 years old.  He was admitted to the hospital and was placed on multiple different medications he says.  For to psychiatry for further evaluation.  He does want to continue taking the Lexapro until he sees psychiatry.  Discussed taking 10 mg of Lexapro for the first week and then increase to 20 mg. ? ?  03/19/2022  ? 12:58 PM 10/02/2021  ?  8:29 AM 01/30/2021  ? 10:15 AM 01/08/2021  ? 11:07 AM 06/19/2020  ?  2:34 PM  ?Depression screen PHQ 2/9  ?Decreased Interest 2 0 1 2 0  ?Down, Depressed, Hopeless 1 0 1 1 0  ?PHQ - 2 Score 3 0 2 3 0  ?Altered sleeping 3 0 1 0 0  ?Tired, decreased energy 3 1 2 3 1   ?Change in appetite 2 0 0 0 1  ?Feeling bad or failure about yourself  1 0 1 1 0  ?Trouble concentrating 3 0 1 3 1   ?Moving slowly or fidgety/restless 3 3 1 3 1   ?Suicidal thoughts 0 0 0 0 0  ?PHQ-9 Score 18 4 8 13 4   ?Difficult doing work/chores Very difficult Somewhat difficult Somewhat difficult Somewhat difficult Not difficult at all  ?  ? ?  03/19/2022  ? 12:58 PM 10/02/2021  ?  8:31 AM 01/30/2021  ? 10:17 AM 01/08/2021  ? 11:06 AM  ?GAD 7 : Generalized Anxiety Score  ?Nervous, Anxious, on Edge 3 2 2 3   ?Control/stop  worrying 3 2 1 2   ?Worry too much - different things 3 2 1 3   ?Trouble relaxing 3 2 3 3   ?Restless 3 2 3 3   ?Easily annoyed or irritable 3 2 2 3   ?Afraid - awful might happen 2 1 1 2   ?Total GAD 7 Score 20 13 13 19   ?Anxiety Difficulty Very difficult Somewhat difficult Somewhat difficult Very difficult  ? ?Snores/fatigue:  He says that he snores every night.  ESS=10. He says he has to drink lots of coffee and energy drinks to stay awake during the day.  He says he was supposed to get a sleep study done but never heard anything.  Will refer to pulmonology for sleep study.  We will also get lab work. ? ?  03/19/2022  ?  1:33 PM  ?Results of the Epworth flowsheet  ?Sitting and reading 1  ?Watching TV 3  ?Sitting, inactive in a public place (e.g. a theatre or  a meeting) 2  ?As a passenger in a car for an hour without a break 1  ?Lying down to rest in the afternoon when circumstances permit 0  ?Sitting and talking to someone 1  ?Sitting quietly after a lunch without alcohol 2  ?In a car, while stopped for a few minutes in traffic 0  ?Total score 10  ?  ?Inattention: Patient states for years he has had trouble focusing and he would like to be tested for ADHD.  We will place referral for testing. ? ?Relevant past medical, surgical, family and social history reviewed and updated as indicated. Interim medical history since our last visit reviewed. ?Allergies and medications reviewed and updated. ? ?Review of Systems ?Constitutional: Negative for fever or weight change.  ?Respiratory: Negative for cough and shortness of breath.   ?Cardiovascular: Negative for chest pain or palpitations.  ?Gastrointestinal: Negative for abdominal pain, no bowel changes.  ?Musculoskeletal: Negative for gait problem or joint swelling.  ?Skin: Negative for rash.  ?Neurological: Negative for dizziness or headache.  ?No other specific complaints in a complete review of systems (except as listed in HPI above).  ? ?   ?Objective:  ?  ?BP 120/80    Pulse 84   Temp 98.2 ?F (36.8 ?C) (Oral)   Resp 18   Ht 5\' 9"  (1.753 m)   Wt 175 lb 12.8 oz (79.7 kg)   SpO2 98%   BMI 25.96 kg/m?   ?Wt Readings from Last 3 Encounters:  ?03/19/22 175 lb 12.8 oz (79.7 kg)  ?10/02/21 183 lb (83 kg)  ?01/30/21 180 lb 1.6 oz (81.7 kg)  ?  ?Physical Exam ? ?Constitutional: Patient appears well-developed and well-nourished.  No distress.  ?HEENT: head atraumatic, normocephalic, pupils equal and reactive to light, neck supple ?Cardiovascular: Normal rate, regular rhythm and normal heart sounds.  No murmur heard. No BLE edema. ?Pulmonary/Chest: Effort normal and breath sounds normal. No respiratory distress. ?Abdominal: Soft.  There is no tenderness. ?Psychiatric: Patient has a normal mood and affect. behavior is normal. Judgment and thought content normal.  ? ?Results for orders placed or performed in visit on 12/30/17  ?BASIC METABOLIC PANEL WITH GFR  ?Result Value Ref Range  ? Glucose, Bld 100 65 - 139 mg/dL  ? BUN 15 7 - 25 mg/dL  ? Creat 0.84 0.60 - 1.35 mg/dL  ? GFR, Est Non African American 116 > OR = 60 mL/min/1.4173m2  ? GFR, Est African American 134 > OR = 60 mL/min/1.3273m2  ? BUN/Creatinine Ratio NOT APPLICABLE 6 - 22 (calc)  ? Sodium 137 135 - 146 mmol/L  ? Potassium 4.7 3.5 - 5.3 mmol/L  ? Chloride 104 98 - 110 mmol/L  ? CO2 28 20 - 32 mmol/L  ? Calcium 10.1 8.6 - 10.3 mg/dL  ?TSH  ?Result Value Ref Range  ? TSH 1.03 0.40 - 4.50 mIU/L  ?CBC  ?Result Value Ref Range  ? WBC 7.1 3.8 - 10.8 Thousand/uL  ? RBC 5.36 4.20 - 5.80 Million/uL  ? Hemoglobin 15.6 13.2 - 17.1 g/dL  ? HCT 45.0 38.5 - 50.0 %  ? MCV 84.0 80.0 - 100.0 fL  ? MCH 29.1 27.0 - 33.0 pg  ? MCHC 34.7 32.0 - 36.0 g/dL  ? RDW 12.6 11.0 - 15.0 %  ? Platelets 330 140 - 400 Thousand/uL  ? MPV 9.4 7.5 - 12.5 fL  ? ?   ?Assessment & Plan:  ? ?1. Generalized anxiety disorder ? ?- Ambulatory referral to Psychiatry ?-  escitalopram (LEXAPRO) 20 MG tablet; Take 1 tablet (20 mg total) by mouth daily.  Dispense: 90 tablet;  Refill: 1 ? ?2. Moderate episode of recurrent major depressive disorder (HCC) ? ?- Ambulatory referral to Psychiatry ?- escitalopram (LEXAPRO) 20 MG tablet; Take 1 tablet (20 mg total) by mouth daily.  Dispense: 90 tablet; Refill: 1 ? ?3. Family history of bipolar disorder ? ?- Ambulatory referral to Psychiatry ? ?4. Snores ? ?- Ambulatory referral to Pulmonology ? ?5. Other fatigue ?- Ambulatory referral to Pulmonology ?- CBC with Differential/Platelet ?- COMPLETE METABOLIC PANEL WITH GFR ?- TSH ? ?6. Inattention ? ?- Ambulatory referral to Psychology  ? ?Follow up plan: ?Return in about 6 months (around 09/19/2022) for follow up. ? ? ? ? ? ?

## 2022-03-20 ENCOUNTER — Ambulatory Visit (INDEPENDENT_AMBULATORY_CARE_PROVIDER_SITE_OTHER): Payer: BC Managed Care – PPO | Admitting: Primary Care

## 2022-03-20 ENCOUNTER — Encounter: Payer: Self-pay | Admitting: Primary Care

## 2022-03-20 VITALS — BP 122/70 | HR 96 | Temp 98.4°F | Ht 69.0 in | Wt 174.4 lb

## 2022-03-20 DIAGNOSIS — F411 Generalized anxiety disorder: Secondary | ICD-10-CM

## 2022-03-20 DIAGNOSIS — G4719 Other hypersomnia: Secondary | ICD-10-CM | POA: Diagnosis not present

## 2022-03-20 DIAGNOSIS — R400 Somnolence: Secondary | ICD-10-CM | POA: Diagnosis not present

## 2022-03-20 DIAGNOSIS — Z720 Tobacco use: Secondary | ICD-10-CM

## 2022-03-20 DIAGNOSIS — J309 Allergic rhinitis, unspecified: Secondary | ICD-10-CM

## 2022-03-20 LAB — COMPLETE METABOLIC PANEL WITH GFR
AG Ratio: 1.7 (calc) (ref 1.0–2.5)
ALT: 10 U/L (ref 9–46)
AST: 14 U/L (ref 10–40)
Albumin: 4.8 g/dL (ref 3.6–5.1)
Alkaline phosphatase (APISO): 49 U/L (ref 36–130)
BUN: 13 mg/dL (ref 7–25)
CO2: 30 mmol/L (ref 20–32)
Calcium: 10.2 mg/dL (ref 8.6–10.3)
Chloride: 102 mmol/L (ref 98–110)
Creat: 0.79 mg/dL (ref 0.60–1.26)
Globulin: 2.8 g/dL (calc) (ref 1.9–3.7)
Glucose, Bld: 85 mg/dL (ref 65–99)
Potassium: 5 mmol/L (ref 3.5–5.3)
Sodium: 140 mmol/L (ref 135–146)
Total Bilirubin: 0.8 mg/dL (ref 0.2–1.2)
Total Protein: 7.6 g/dL (ref 6.1–8.1)
eGFR: 118 mL/min/{1.73_m2} (ref >=60)

## 2022-03-20 LAB — CBC WITH DIFFERENTIAL/PLATELET
Absolute Monocytes: 846 cells/uL (ref 200–950)
Basophils Absolute: 84 cells/uL (ref 0–200)
Basophils Relative: 0.9 %
Eosinophils Absolute: 167 cells/uL (ref 15–500)
Eosinophils Relative: 1.8 %
HCT: 43.9 % (ref 38.5–50.0)
Hemoglobin: 15 g/dL (ref 13.2–17.1)
Lymphs Abs: 2660 cells/uL (ref 850–3900)
MCH: 29.1 pg (ref 27.0–33.0)
MCHC: 34.2 g/dL (ref 32.0–36.0)
MCV: 85.2 fL (ref 80.0–100.0)
MPV: 9.4 fL (ref 7.5–12.5)
Monocytes Relative: 9.1 %
Neutro Abs: 5543 cells/uL (ref 1500–7800)
Neutrophils Relative %: 59.6 %
Platelets: 331 10*3/uL (ref 140–400)
RBC: 5.15 10*6/uL (ref 4.20–5.80)
RDW: 12.7 % (ref 11.0–15.0)
Total Lymphocyte: 28.6 %
WBC: 9.3 10*3/uL (ref 3.8–10.8)

## 2022-03-20 LAB — TSH: TSH: 1.12 mIU/L (ref 0.40–4.50)

## 2022-03-20 NOTE — Assessment & Plan Note (Addendum)
-   Chronic nasal congestion. Continue Xyzal and prn fluticasone nasal spray. He may benefit from ENT evaluation once sleep study is completed.  ?

## 2022-03-20 NOTE — Progress Notes (Signed)
? ?@Patient  ID: , male    DOB: [birth year], 36 y.o.   MRN: [id number] ? ?Chief Complaint  ?Patient presents with  ? sleep consult  ?  C/o occ wakes gasping for air, daytime sleepiness and wakes not feeling well rested.    ? ? ?Referring provider: ?[id number], FNP ? ?HPI: ?36 year old male, current someday smoker.  Past medical history significant for generalized anxiety disorder, bipolar disorder, tobacco use. ? ?03/20/2022 ?Patient presents today for sleep consult. He reports symptoms of non-restorative sleep. He has on occasion woken up gasping for air. His wife has told him that he snores. He does not feel well rested when he wakes up in the morning. He has associated daytime sleepiness/fatigue. Typical bedtime is between 10-11pm. He has no trouble falling or staying asleep. He sleeps better and feels more rested when he falls asleep on the couch laying on his side. He starts his day at 6-6:30am. He has chronic sinus congestion, recently started taking xyzal two days ago and will use OTC nasal spray as needed. He is an occasional smoker, 1 pack of cigarettes will last him a week. He has rare shortness of breath symptoms. Denies narcolepsy, cataplexy or sleep walking.  ? ?Sleep questionnaire ?Symptoms-occasional snoring/wakes up gasping for air, nonrestorative sleep ?Sleep study-none prior ?Bedtime- 10-11pm ?Time to fall asleep- 30 mins ?Nocturnal awakenings- zero ?Start day- 6-6:30am ?Do you operate heavy machinery- No ?Weight changes in 2 years- 20 lbs ?Do you wear cpap- non ?Do you wear oxygen- no ?Epworth-11 ? ? ?Allergies  ?Allergen Reactions  ? Depakote [Divalproex Sodium] Anaphylaxis  ? ? ?Immunization History  ?Administered Date(s) Administered  ? Influenza,inj,Quad PF,6+ Mos 12/30/2017  ? Tdap 12/30/2017  ? ? ?Past Medical History:  ?Diagnosis Date  ? Anxiety   ? Bipolar disorder (HCC)   ? Panic attacks   ? ? ?Tobacco History: ?Social History  ? ?Tobacco Use  ?Smoking Status Some Days   ? Types: Cigarettes  ?Smokeless Tobacco Current  ? Types: Chew  ?Tobacco Comments  ? 1 pack per week--03/20/2022  ? ?Ready to quit: Not Answered ?Counseling given: Not Answered ?Tobacco comments: 1 pack per week--03/20/2022 ? ? ?Outpatient Medications Prior to Visit  ?Medication Sig Dispense Refill  ? escitalopram (LEXAPRO) 20 MG tablet Take 1 tablet (20 mg total) by mouth daily. 90 tablet 1  ? ?No facility-administered medications prior to visit.  ? ?Review of Systems ? ?Review of Systems  ?Constitutional:  Positive for fatigue.  ?HENT: Negative.    ?Respiratory:  Positive for apnea.   ?Cardiovascular: Negative.   ?Psychiatric/Behavioral:  Negative for sleep disturbance.   ? ? ?Physical Exam ? ?BP 122/70 (BP Location: Left Arm, Cuff Size: Normal)   Pulse 96   Temp 98.4 ?F (36.9 ?C) (Temporal)   Ht 5\' 9"  (1.753 m)   Wt 174 lb 6.4 oz (79.1 kg)   SpO2 98%   BMI 25.75 kg/m?  ?Physical Exam ?Constitutional:   ?   Appearance: Normal appearance.  ?HENT:  ?   Head: Normocephalic and atraumatic.  ?   Nose: Congestion present.  ?   Mouth/Throat:  ?   Mouth: Mucous membranes are moist.  ?   Pharynx: Oropharynx is clear.  ?Cardiovascular:  ?   Rate and Rhythm: Normal rate and regular rhythm.  ?Pulmonary:  ?   Effort: Pulmonary effort is normal.  ?   Breath sounds: Normal breath sounds.  ?Musculoskeletal:     ?   General: Normal range  of motion.  ?Skin: ?   General: Skin is warm and dry.  ?Neurological:  ?   General: No focal deficit present.  ?   Mental Status: He is alert and oriented to person, place, and time. Mental status is at baseline.  ?Psychiatric:     ?   Mood and Affect: Mood normal.     ?   Behavior: Behavior normal.     ?   Thought Content: Thought content normal.     ?   Judgment: Judgment normal.  ?  ? ?Lab Results: ? ?CBC ?   ?Component Value Date/Time  ? WBC 9.3 03/19/2022 1345  ? RBC 5.15 03/19/2022 1345  ? HGB 15.0 03/19/2022 1345  ? HGB 12.4 (L) 01/13/2012 0431  ? HCT 43.9 03/19/2022 1345  ? HCT 36.9 (L)  01/13/2012 0431  ? PLT 331 03/19/2022 1345  ? PLT 236 01/13/2012 0431  ? MCV 85.2 03/19/2022 1345  ? MCV 88 01/13/2012 0431  ? MCH 29.1 03/19/2022 1345  ? MCHC 34.2 03/19/2022 1345  ? RDW 12.7 03/19/2022 1345  ? RDW 13.2 01/13/2012 0431  ? LYMPHSABS 2,660 03/19/2022 1345  ? LYMPHSABS 2.0 01/13/2012 0431  ? MONOABS 0.7 01/13/2012 0431  ? EOSABS 167 03/19/2022 1345  ? EOSABS 0.2 01/13/2012 0431  ? BASOSABS 84 03/19/2022 1345  ? BASOSABS 0.1 01/13/2012 0431  ? ? ?BMET ?   ?Component Value Date/Time  ? NA 140 03/19/2022 1345  ? NA 141 01/10/2012 0500  ? K 5.0 03/19/2022 1345  ? K 4.1 01/10/2012 0500  ? CL 102 03/19/2022 1345  ? CL 106 01/10/2012 0500  ? CO2 30 03/19/2022 1345  ? CO2 26 01/10/2012 0500  ? GLUCOSE 85 03/19/2022 1345  ? GLUCOSE 102 (H) 01/10/2012 0500  ? BUN 13 03/19/2022 1345  ? BUN 17 01/10/2012 0500  ? CREATININE 0.79 03/19/2022 1345  ? CALCIUM 10.2 03/19/2022 1345  ? CALCIUM 8.7 01/10/2012 0500  ? GFRNONAA 116 12/30/2017 1021  ? GFRAA 134 12/30/2017 1021  ? ? ?BNP ?No results found for: BNP ? ?ProBNP ?No results found for: PROBNP ? ?Imaging: ?No results found. ? ? ?Assessment & Plan:  ? ?Daytime sleepiness ?- Patient has symptoms of nonrestorative sleep/daytime sleepiness, snoring, occasionally wakes up gasping for air.  Epworth 11.  BMI 25.  Concern patient could have obstructive sleep apnea, needs home sleep study to evaluate for OSA.  Discussed risks of untreated sleep apnea include cardiac arrhythmias, stroke, pulmonary hypertension or diabetes.  We also discussed treatment options including weight loss, oral appliance, CPAP or referral to ENT for possible surgical options.  Encourage side sleeping position.  Advised against driving experiencing excessive daytime sleepiness and fatigue.  Recommend patient follow-up in 4 to 6 weeks to review sleep study results and treatment options if needed. ? ?Chronic allergic rhinitis ?- Chronic nasal congestion. Continue Xyzal and prn fluticasone nasal spray.  He may benefit from ENT evaluation once sleep study is completed.  ? ?Generalized anxiety disorder ?- Continue Lexapro 20mg  daily  ? ?Tobacco use ?- Encourage smoking cessation  ? ? ? , NP ?03/20/2022 ? ?

## 2022-03-20 NOTE — Assessment & Plan Note (Addendum)
-   Patient has symptoms of nonrestorative sleep/daytime sleepiness, snoring, occasionally wakes up gasping for air.  Epworth 11.  BMI 25.  Concern patient could have obstructive sleep apnea, needs home sleep study to evaluate for OSA.  Discussed risks of untreated sleep apnea include cardiac arrhythmias, stroke, pulmonary hypertension or diabetes.  We also discussed treatment options including weight loss, oral appliance, CPAP or referral to ENT for possible surgical options.  Encourage side sleeping position.  Advised against driving experiencing excessive daytime sleepiness and fatigue.  Recommend patient follow-up in 4 to 6 weeks to review sleep study results and treatment options if needed. ?

## 2022-03-20 NOTE — Patient Instructions (Signed)
Risk of untreated sleep apnea include cardiac arrhythmias, stroke, pulm HTN and diabetes ?  ?Treatment options include weight loss, oral appliance, CPAP therapy or referral to ENT for possible surgical options  ?  ?Recommendations: ?Do not drive if experiencing excessive daytime sleepiness  ?Sleep on your side or elevate your head while sleeping ?  ?Orders: ?HST re: snoring  ?  ?Follow-up: ?Please call after sleep study is completed to scheduled follow-up with [facility] NP to review sleep study results and treatment if needed  ?

## 2022-03-20 NOTE — Assessment & Plan Note (Signed)
Encourage smoking cessation

## 2022-03-20 NOTE — Assessment & Plan Note (Signed)
Continue Lexapro 20 mg daily.

## 2022-03-21 NOTE — Progress Notes (Signed)
Reviewed and agree with assessment/plan. ? ? ?Chesley Mires, MD ?[place] ?03/21/2022, 8:30 AM ?Pager:  [phone] ? ?

## 2022-04-24 ENCOUNTER — Encounter: Payer: Self-pay | Admitting: Psychiatry

## 2022-04-24 ENCOUNTER — Ambulatory Visit (INDEPENDENT_AMBULATORY_CARE_PROVIDER_SITE_OTHER): Payer: BC Managed Care – PPO | Admitting: Psychiatry

## 2022-04-24 VITALS — BP 117/80 | HR 61 | Temp 98.5°F | Wt 175.2 lb

## 2022-04-24 DIAGNOSIS — F419 Anxiety disorder, unspecified: Secondary | ICD-10-CM | POA: Diagnosis not present

## 2022-04-24 DIAGNOSIS — F1590 Other stimulant use, unspecified, uncomplicated: Secondary | ICD-10-CM

## 2022-04-24 DIAGNOSIS — R41840 Attention and concentration deficit: Secondary | ICD-10-CM | POA: Diagnosis not present

## 2022-04-24 DIAGNOSIS — F17200 Nicotine dependence, unspecified, uncomplicated: Secondary | ICD-10-CM | POA: Diagnosis not present

## 2022-04-24 MED ORDER — VENLAFAXINE HCL ER 37.5 MG PO CP24: 37.5000 mg | ORAL_CAPSULE | Freq: Every day | ORAL | 1 refills | Status: DC

## 2022-04-24 MED ORDER — ESCITALOPRAM OXALATE 5 MG PO TABS: 5.0000 mg | ORAL_TABLET | Freq: Every day | ORAL | 0 refills | Status: DC

## 2022-04-24 NOTE — Progress Notes (Signed)
Psychiatric Initial Adult Assessment   Patient Identification: Jeffery DenverStephen Gomez Benassi MRN:  [id number] Date of Evaluation:  04/24/2022 Referral Source: Della GooJulie Pender FNP  Chief Complaint:   Chief Complaint  Patient presents with   Establish Care: 36 year old Caucasian male, employed, married, has multiple diagnoses in the past including generalized anxiety disorder, bipolar disorder, presented to establish care.   Visit Diagnosis:    ICD-10-CM   1. Anxiety disorder, unspecified type  F41.9 escitalopram (LEXAPRO) 5 MG tablet    venlafaxine XR (EFFEXOR XR) 37.5 MG 24 hr capsule    2. Attention and concentration deficit  R41.840 escitalopram (LEXAPRO) 5 MG tablet    venlafaxine XR (EFFEXOR XR) 37.5 MG 24 hr capsule    3. 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 Tobacco use disorder  F17.200       History of Present Illness:  Jeffery DenverStephen Gomez Bitting is a 36 year old Caucasian male, married, employed, lives in [place], has a history of anxiety, attention and focus deficit, excessive daytime sleepiness/fatigue, presented to establish care.  Patient reports he has been struggling with anxiety, feels often nervous, on edge, jittery, inability to relax.  Patient reports he has been this way all his life.  His anxiety symptoms started getting worse a few years ago.  Patient reports in 2019 he was started on Lexapro.  Currently takes Lexapro 20 mg daily.  He has not noticed much benefit from the Lexapro.  It is hard for him to relax even on the Lexapro.  Patient does report sleep problems.  He reports he sleeps okay at night however in the morning it is hard for him to wake up since he feels groggy.  He does not know if he snores or not.  Patient reports he self medicates himself by using a lot of caffeinated drinks like energy drinks throughout the day and that helps with his energy level.  Patient reports he likely could be withdrawing from caffeine since he does feel jittery when he is due for the  next drink.  Patient denies any manic or hypomanic symptoms.  Patient does struggle with attention and focus problems.  Patient reports he has trouble staying on task, lacks organizational skills, and always has to be on the go, inability to sit still, easily distracted, failure to keep up with appointments, procrastinates a lot.  Patient reports he had struggles with his attention and focus growing up, likely in elementary, middle school as well as in high school.  Patient reports he was in academically gifted classes in elementary school.  He however dropped out of high school couple of times, eventually was able to get his GED.  Patient reports he has a family history of ADHD, his brother was diagnosed with it.  Patient denies any suicidality, homicidality or perceptual disturbances.       Associated Signs/Symptoms: Depression Symptoms:  difficulty concentrating, anxiety, loss of energy/fatigue, disturbed sleep, (Hypo) Manic Symptoms:  Distractibility, Impulsivity, Anxiety Symptoms:   anxiety unspecified Psychotic Symptoms:   Denies PTSD Symptoms: Negative  Past Psychiatric History: Patient does report a history of inpatient behavioral health admission at [facility] when he was younger.  Patient reports he drank alcohol and ended up in the hospital that time.  Patient reports he may have been diagnosed with bipolar disorder and was started on mood stabilizers however developed side effects and did not continue those medications.  He does not clearly remember the names of medications at this time.  Patient most  recently was diagnosed with anxiety disorder, depression by his primary care provider-tried on medications like BuSpar-had side effects.  Previous Psychotropic Medications: Yes BuSpar-side effects, Depakote-side effects, Lexapro, Zyprexa  Substance Abuse History in the last 12 months:  Yes.  Reports overusing caffeine-does not know the extent of his use-likely has caffeine use  disorder.  Consequences of Substance Abuse: Unknown if caffeine use is making anxiety worse  Past Medical History:  Past Medical History:  Diagnosis Date   Anxiety    Bipolar disorder (HCC)    Panic attacks     Past Surgical History:  Procedure Laterality Date   NO PAST SURGERIES      Family Psychiatric History: As noted below.  Family History:  Family History  Problem Relation Age of Onset   Anxiety disorder Mother    Panic disorder Mother    Bipolar disorder Father    Schizophrenia Father    Alcohol abuse Brother    Bipolar disorder Brother    ADD / ADHD Brother    Anxiety disorder Brother    Bipolar disorder Maternal Aunt    Alcohol abuse Maternal Grandfather    Cirrhosis Maternal Grandfather    Heart attack Maternal Grandmother    Bipolar disorder Paternal Grandmother    Thyroid disease Paternal Grandmother     Social History:   Social History   Socioeconomic History   Marital status: Married    Spouse name: Chelsie   Number of children: 3   Years of education: 11 TH GRADE   Highest education level: Not on file  Occupational History   Not on file  Tobacco Use   Smoking status: Some Days    Types: Cigarettes   Smokeless tobacco: Current    Types: Chew   Tobacco comments:    1 pack per week--03/20/2022  Vaping Use   Vaping Use: Never used  Substance and Sexual Activity   Alcohol use: Yes    Comment: occasinal   Drug use: No   Sexual activity: Yes    Partners: Female  Other Topics Concern   Not on file  Social History Narrative   Not on file   Social Determinants of Health   Financial Resource Strain: Not on file  Food Insecurity: Not on file  Transportation Needs: Not on file  Physical Activity: Inactive (04/22/2019)   Exercise Vital Sign    Days of Exercise per Week: 0 days    Minutes of Exercise per Session: 0 min  Stress: Not on file  Social Connections: Somewhat Isolated (04/22/2019)   Social Connection and Isolation Panel [NHANES]     Frequency of Communication with Friends and Family: More than three times a week    Frequency of Social Gatherings with Friends and Family: More than three times a week    Attends Religious Services: Never    Database administrator or Organizations: No    Attends Banker Meetings: Never    Marital Status: Married    Additional Social History: Patient was born in [place].  Patient graduated high school.  Patient is currently married, has 4 children altogether.  Patient has a 6 year old son from a previous relationship, 43 year old daughter, 68-year-old son and a 82-year-old daughter.  Reports his relationship with his wife is going well.  Currently works as a Civil Service fast streamer for [employer].  Patient denies any current pending legal problems.  Patient currently lives in [place].  Allergies:   Allergies  Allergen Reactions   Depakote [Divalproex Sodium] Anaphylaxis  Metabolic Disorder Labs: No results found for: "HGBA1C", "MPG" No results found for: "PROLACTIN" No results found for: "CHOL", "TRIG", "HDL", "CHOLHDL", "VLDL", "LDLCALC" Lab Results  Component Value Date   TSH 1.12 03/19/2022    Therapeutic Level Labs: No results found for: "LITHIUM" No results found for: "CBMZ" No results found for: "VALPROATE"  Current Medications: Current Outpatient Medications  Medication Sig Dispense Refill   escitalopram (LEXAPRO) 5 MG tablet Take 1 tablet (5 mg total) by mouth daily for 14 days. Stop taking after 2 weeks , being weaned off 14 tablet 0   venlafaxine XR (EFFEXOR XR) 37.5 MG 24 hr capsule Take 1 capsule (37.5 mg total) by mouth daily with breakfast. 30 capsule 1   No current facility-administered medications for this visit.    Musculoskeletal: Strength & Muscle Tone: within normal limits Gait & Station: normal Patient leans: N/A  Psychiatric Specialty Exam: Review of Systems  Constitutional:  Positive for fatigue.  Psychiatric/Behavioral:  Positive for  decreased concentration. The patient is nervous/anxious.   All other systems reviewed and are negative.   Blood pressure 117/80, pulse 61, temperature 98.5 F (36.9 C), temperature source Temporal, weight 175 lb 3.2 oz (79.5 kg).Body mass index is 25.87 kg/Gomez.  General Appearance: Casual  Eye Contact:  Fair  Speech:  Clear and Coherent  Volume:  Normal  Mood:  Anxious  Affect:  Congruent  Thought Process:  Goal Directed and Descriptions of Associations: Intact  Orientation:  Full (Time, Place, and Person)  Thought Content:  Logical  Suicidal Thoughts:  No  Homicidal Thoughts:  No  Memory:  Immediate;   Fair Recent;   Fair Remote;   Fair  Judgement:  Fair  Insight:  Fair  Psychomotor Activity:  Normal  Concentration:  Concentration: Fair and Attention Span: Fair  Recall:  [employer] of Knowledge:Fair  Language: Fair  Akathisia:  No  Handed:  Right  AIMS (if indicated):  not done  Assets:  Communication Skills Desire for Improvement Housing Social Support Talents/Skills Transportation  ADL's:  Intact  Cognition: WNL  Sleep:  Fair   Screenings: GAD-7    Flowsheet Row Office Visit from 04/24/2022 in [facility] Office Visit from 03/19/2022 in [facility] Office Visit from 10/02/2021 in [facility] Office Visit from 01/30/2021 in [facility] Office Visit from 01/08/2021 in [facility]  Total GAD-7 Score 14 20 13 13 19       PHQ2-9    Flowsheet Row Office Visit from 04/24/2022 in [facility] Office Visit from 03/19/2022 in [facility] Office Visit from 10/02/2021 in [facility] Office Visit from 01/30/2021 in [facility] Office Visit from 01/08/2021 in [facility]  PHQ-2 Total Score 1 3 0 2 3  PHQ-9 Total Score 11 18 4 8 13       Flowsheet Row Office Visit from  04/24/2022 in [facility]  C-SSRS RISK CATEGORY No Risk       Assessment and Plan: DAREN YEAGLE is a 36 year old Caucasian male, married, employed, lives in [place], has a history of anxiety, attention and focus deficit, excessive daytime sleepiness/fatigue, presented to establish care.  Patient with anxiety symptoms, attention and focus deficit, excessive caffeine use, will benefit from the following plan. The patient demonstrates the following risk factors for suicide: Chronic risk factors for suicide include: psychiatric disorder of anxiety and substance use disorder caffeine use.  Acute risk factors for suicide include: N/A. Protective factors for this patient include: positive social support, positive therapeutic relationship, coping skills, hope for the future, and life satisfaction. Considering these factors, the overall suicide risk at this point appears to be low. Patient is appropriate for outpatient follow up.  Plan Anxiety disorder unspecified-unstable Taper off Lexapro.  Patient advised to take Lexapro 5 mg p.o. daily for 2 weeks and stop taking it. Start venlafaxine extended release 37.5 mg p.o. daily. Referred for CBT-patient to establish care with therapist.  Attention and concentration deficit-unstable Will refer for ADHD testing-Dr. Altabet  Caffeine use disorder likely mild-unstable Provided education.  Patient to cut back.   Tobacco use disorder-unstable Provided counseling for 2 minutes.  Reviewed notes per provider-Ms. Pender-dated 03/19/2022-patient with history of generalized anxiety disorder, MDD-Lexapro 20 mg p.o. daily.  Family history of bipolar disorder-ambulatory referral to psychiatry.  Patient also with history of snoring, fatigue-referred to pulmonology.  Follow-up in clinic in 3 to 4 weeks or sooner if needed.    This note was generated in part or whole with voice recognition software. Voice recognition is usually quite  accurate but there are transcription errors that can and very often do occur. I apologize for any typographical errors that were not detected and corrected.    Jomarie Longs, MD 6/8/20233:35 PM

## 2022-04-24 NOTE — Patient Instructions (Signed)
Venlafaxine Extended-Release Capsules What is this medication? VENLAFAXINE (VEN la fax een) treats depression and anxiety. It increases the amount of serotonin and norepinephrine in the brain, hormones that help regulate mood. It belongs to a group of medications called SNRIs. This medicine may be used for other purposes; ask your health care provider or pharmacist if you have questions. COMMON BRAND NAME(S): Effexor XR What should I tell my care team before I take this medication? They need to know if you have any of these conditions: Bleeding disorders Glaucoma Heart disease High blood pressure High cholesterol Kidney disease Liver disease Low levels of sodium in the blood Mania or bipolar disorder Seizures Suicidal thoughts, plans, or attempt; a previous suicide attempt by you or a family Take medications that treat or prevent blood clots Thyroid disease An unusual or allergic reaction to venlafaxine, desvenlafaxine, other medications, foods, dyes, or preservatives Pregnant or trying to get pregnant Breast-feeding How should I use this medication? Take this medication by mouth with a full glass of water. Follow the directions on the prescription label. Do not cut, crush, or chew this medication. Take it with food. If needed, the capsule may be carefully opened and the entire contents sprinkled on a spoonful of cool applesauce. Swallow the applesauce/pellet mixture right away without chewing and follow with a glass of water to ensure complete swallowing of the pellets. Try to take your medication at about the same time each day. Do not take your medication more often than directed. Do not stop taking this medication suddenly except upon the advice of your care team. Stopping this medication too quickly may cause serious side effects or your condition may worsen. A special MedGuide will be given to you by the pharmacist with each prescription and refill. Be sure to read this information  carefully each time. Talk to your care team regarding the use of this medication in children. Special care may be needed. Overdosage: If you think you have taken too much of this medicine contact a poison control center or emergency room at once. NOTE: This medicine is only for you. Do not share this medicine with others. What if I miss a dose? If you miss a dose, take it as soon as you can. If it is almost time for your next dose, take only that dose. Do not take double or extra doses. What may interact with this medication? Do not take this medication with any of the following: Certain medications for fungal infections like fluconazole, itraconazole, ketoconazole, posaconazole, voriconazole Cisapride Desvenlafaxine Dronedarone Duloxetine Levomilnacipran Linezolid MAOIs like Carbex, Eldepryl, Marplan, Nardil, and Parnate Methylene blue (injected into a vein) Milnacipran Pimozide Thioridazine This medication may also interact with the following: Amphetamines Aspirin and aspirin-like medications Certain medications for depression, anxiety, or psychotic disturbances Certain medications for migraine headaches like almotriptan, eletriptan, frovatriptan, naratriptan, rizatriptan, sumatriptan, zolmitriptan Certain medications for sleep Certain medications that treat or prevent blood clots like dalteparin, enoxaparin, warfarin Cimetidine Clozapine Diuretics Fentanyl Furazolidone Indinavir Isoniazid Lithium Metoprolol NSAIDS, medications for pain and inflammation, like ibuprofen or naproxen Other medications that prolong the QT interval (cause an abnormal heart rhythm) like dofetilide, ziprasidone Procarbazine Rasagiline Supplements like St. John's wort, kava kava, valerian Tramadol Tryptophan This list may not describe all possible interactions. Give your health care provider a list of all the medicines, herbs, non-prescription drugs, or dietary supplements you use. Also tell them  if you smoke, drink alcohol, or use illegal drugs. Some items may interact with your medicine. What should   I watch for while using this medication? Tell your care team if your symptoms do not get better or if they get worse. Visit your care team for regular checks on your progress. Because it may take several weeks to see the full effects of this medication, it is important to continue your treatment as prescribed by your care team. Watch for new or worsening thoughts of suicide or depression. This includes sudden changes in mood, behaviors, or thoughts. These changes can happen at any time but are more common in the beginning of treatment or after a change in dose. Call your care team right away if you experience these thoughts or worsening depression. Manic episodes may happen in patients with bipolar disorder who take this medication. Watch for changes in feelings or behaviors such as feeling anxious, nervous, agitated, panicky, irritable, hostile, aggressive, impulsive, severely restless, overly excited and hyperactive, or trouble sleeping. These changes can happen at any time but are more common in the beginning of treatment or after a change in dose. Call your care team right away if you notice any of these symptoms. This medication can cause an increase in blood pressure. Check with your care team for instructions on monitoring your blood pressure while taking this medication. You may get drowsy or dizzy. Do not drive, use machinery, or do anything that needs mental alertness until you know how this medication affects you. Do not stand or sit up quickly, especially if you are an older patient. This reduces the risk of dizzy or fainting spells. Do not drink alcohol while taking this medication. Drinking alcohol may alter the effects of your medication. Serious side effects may occur. Your mouth may get dry. Chewing sugarless gum, sucking hard candy and drinking plenty of water will help. Contact your  care team if the problem does not go away or is severe. What side effects may I notice from receiving this medication? Side effects that you should report to your care team as soon as possible: Allergic reactions--skin rash, itching, hives, swelling of the face, lips, tongue, or throat Bleeding--bloody or black, tar-like stools, red or dark brown urine, vomiting blood or brown material that looks like coffee grounds, small, red or purple spots on skin, unusual bleeding or bruising Heart rhythm changes--fast or irregular heartbeat, dizziness, feeling faint or lightheaded, chest pain, trouble breathing Increase in blood pressure Loss of appetite with weight loss Low sodium level--muscle weakness, fatigue, dizziness, headache, confusion Serotonin syndrome--irritability, confusion, fast or irregular heartbeat, muscle stiffness, twitching muscles, sweating, high fever, seizures, chills, vomiting, diarrhea Sudden eye pain or change in vision such as blurry vision, seeing halos around lights, vision loss Thoughts of suicide or self-harm, worsening mood, feelings of depression Side effects that usually do not require medical attention (report to your care team if they continue or are bothersome): Anxiety, nervousness Change in sex drive or performance Dizziness Dry mouth Excessive sweating Nausea Tremors or shaking Trouble sleeping This list may not describe all possible side effects. Call your doctor for medical advice about side effects. You may report side effects to FDA at 1-800-FDA-1088. Where should I keep my medication? Keep out of the reach of children and pets. Store at a controlled temperature between 20 and 25 degrees C (68 degrees and 77 degrees F), in a dry place. Throw away any unused medication after the expiration date. NOTE: This sheet is a summary. It may not cover all possible information. If you have questions about this medicine, talk to your doctor,   pharmacist, or health care  provider.  2023 Elsevier/Gold Standard (2021-06-04 00:00:00)  

## 2022-05-03 ENCOUNTER — Ambulatory Visit: Payer: BC Managed Care – PPO

## 2022-05-03 DIAGNOSIS — G4733 Obstructive sleep apnea (adult) (pediatric): Secondary | ICD-10-CM

## 2022-05-03 DIAGNOSIS — G4719 Other hypersomnia: Secondary | ICD-10-CM

## 2022-05-10 DIAGNOSIS — G4733 Obstructive sleep apnea (adult) (pediatric): Secondary | ICD-10-CM

## 2022-05-17 ENCOUNTER — Other Ambulatory Visit: Payer: Self-pay | Admitting: Psychiatry

## 2022-05-17 DIAGNOSIS — R41840 Attention and concentration deficit: Secondary | ICD-10-CM

## 2022-05-17 DIAGNOSIS — F419 Anxiety disorder, unspecified: Secondary | ICD-10-CM

## 2022-05-23 ENCOUNTER — Encounter: Payer: Self-pay | Admitting: Psychiatry

## 2022-05-23 ENCOUNTER — Ambulatory Visit (INDEPENDENT_AMBULATORY_CARE_PROVIDER_SITE_OTHER): Payer: BC Managed Care – PPO | Admitting: Psychiatry

## 2022-05-23 VITALS — BP 114/69 | HR 72 | Ht 70.0 in | Wt 169.0 lb

## 2022-05-23 DIAGNOSIS — R41840 Attention and concentration deficit: Secondary | ICD-10-CM

## 2022-05-23 DIAGNOSIS — F419 Anxiety disorder, unspecified: Secondary | ICD-10-CM

## 2022-05-23 DIAGNOSIS — F17200 Nicotine dependence, unspecified, uncomplicated: Secondary | ICD-10-CM

## 2022-05-23 DIAGNOSIS — F1590 Other stimulant use, unspecified, uncomplicated: Secondary | ICD-10-CM | POA: Diagnosis not present

## 2022-05-23 MED ORDER — VENLAFAXINE HCL ER 75 MG PO CP24: 75.0000 mg | ORAL_CAPSULE | Freq: Every day | ORAL | 1 refills | Status: DC

## 2022-05-23 NOTE — Patient Instructions (Signed)
[email or web address]  [email or web address]  Joann Warren - [phone]  

## 2022-05-23 NOTE — Progress Notes (Signed)
BH MD OP Progress Note  05/24/2035 4:30 PM Jeffery Gomez  MRN:  [id number]  Chief Complaint:  Chief Complaint  Patient presents with   Follow-up: 36 year old Caucasian male, with history of anxiety, attention and focus deficit, presented for medication management.   HPI: Jeffery COYE ' Matt", is a 36 year old Caucasian male, married, employed, lives in [place], has a history of anxiety unspecified, attention and focus deficit, excessive daytime sleepiness, presented for medication management.  Patient today reports since being on the venlafaxine he has noticed that he is more calm.  He however continues to have nervousness, restlessness, trouble relaxing.  Interested in dosage increase of venlafaxine.  Denies any side effects at this time.  Reports he continues to use caffeine daily.  Couple of nights ago he had trouble sleeping, not sure if this is due to using a lot of caffeine that day or not.  Patient to monitor.  Has not been able to establish care with a therapist.  Provided resources.  Motivated to do so.  Patient with attention and focus deficit, has been referred for ADHD testing.  Reports work is going well.  Able to function okay.  Denies any suicidality, homicidality or perceptual disturbances.  Patient denies any other concerns today.  Visit Diagnosis:    ICD-10-CM   1. Anxiety disorder, unspecified type  F41.9 venlafaxine XR (EFFEXOR XR) 75 MG 24 hr capsule    2. Attention and concentration deficit  R41.840     3. 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 Tobacco use disorder  F17.200       Past Psychiatric History: Reviewed past psychiatric history from progress note on 04/24/2022.  Past trials of medications like BuSpar-side effects, Depakote-side effects, Lexapro, Zyprexa.  Past Medical History:  Past Medical History:  Diagnosis Date   Anxiety    Bipolar disorder (HCC)    Panic attacks     Past Surgical History:  Procedure Laterality Date    NO PAST SURGERIES      Family Psychiatric History: Reviewed family psychiatric history from progress note on 04/24/2022.  Family History:  Family History  Problem Relation Age of Onset   Anxiety disorder Mother    Panic disorder Mother    Bipolar disorder Father    Schizophrenia Father    Alcohol abuse Brother    Bipolar disorder Brother    ADD / ADHD Brother    Anxiety disorder Brother    Bipolar disorder Maternal Aunt    Alcohol abuse Maternal Grandfather    Cirrhosis Maternal Grandfather    Heart attack Maternal Grandmother    Bipolar disorder Paternal Grandmother    Thyroid disease Paternal Grandmother     Social History: Reviewed social history from progress note on 04/24/2022. Social History   Socioeconomic History   Marital status: Married    Spouse name: Chelsie   Number of children: 3   Years of education: 11 TH GRADE   Highest education level: Not on file  Occupational History   Not on file  Tobacco Use   Smoking status: Some Days    Types: Cigarettes   Smokeless tobacco: Current    Types: Chew   Tobacco comments:    1 pack per week--03/20/2022  Vaping Use   Vaping Use: Never used  Substance and Sexual Activity   Alcohol use: Yes    Comment: occasinal   Drug use: No   Sexual activity: Yes    Partners: Female  Other  Topics Concern   Not on file  Social History Narrative   Not on file   Social Determinants of Health   Financial Resource Strain: Low Risk  (04/22/2019)   Overall Financial Resource Strain (CARDIA)    Difficulty of Paying Living Expenses: Not hard at all  Food Insecurity: No Food Insecurity (04/22/2019)   Hunger Vital Sign    Worried About Running Out of Food in the Last Year: Never true    Ran Out of Food in the Last Year: Never true  Transportation Needs: No Transportation Needs (04/22/2019)   PRAPARE - Hydrologist (Medical): No    Lack of Transportation (Non-Medical): No  Physical Activity: Inactive  (04/22/2019)   Exercise Vital Sign    Days of Exercise per Week: 0 days    Minutes of Exercise per Session: 0 min  Stress: No Stress Concern Present (04/22/2019)   [place]    Feeling of Stress : Only a little  Social Connections: Somewhat Isolated (04/22/2019)   Social Connection and Isolation Panel [NHANES]    Frequency of Communication with Friends and Family: More than three times a week    Frequency of Social Gatherings with Friends and Family: More than three times a week    Attends Religious Services: Never    Marine scientist or Organizations: No    Attends Archivist Meetings: Never    Marital Status: Married    Allergies:  Allergies  Allergen Reactions   Depakote [Divalproex Sodium] Anaphylaxis    Metabolic Disorder Labs: No results found for: "HGBA1C", "MPG" No results found for: "PROLACTIN" No results found for: "CHOL", "TRIG", "HDL", "CHOLHDL", "VLDL", "LDLCALC" Lab Results  Component Value Date   TSH 1.12 03/19/2022   TSH 1.03 12/30/2017    Therapeutic Level Labs: No results found for: "LITHIUM" No results found for: "VALPROATE" No results found for: "CBMZ"  Current Medications: Current Outpatient Medications  Medication Sig Dispense Refill   venlafaxine XR (EFFEXOR XR) 75 MG 24 hr capsule Take 1 capsule (75 mg total) by mouth daily with breakfast. 30 capsule 1   No current facility-administered medications for this visit.     Musculoskeletal: Strength & Muscle Tone: within normal limits Gait & Station: normal Patient leans: N/A  Psychiatric Specialty Exam: Review of Systems  Psychiatric/Behavioral:  Positive for decreased concentration. The patient is nervous/anxious.   All other systems reviewed and are negative.   Blood pressure 114/69, pulse 72, height 5\' 10"  (1.778 m), weight 169 lb (76.7 kg), SpO2 96 %.Body mass index is 24.25 kg/m.  General Appearance: Casual   Eye Contact:  Fair  Speech:  Clear and Coherent  Volume:  Normal  Mood:  Anxious  Affect:  Congruent  Thought Process:  Goal Directed and Descriptions of Associations: Intact  Orientation:  Full (Time, Place, and Person)  Thought Content: Logical   Suicidal Thoughts:  No  Homicidal Thoughts:  No  Memory:  Immediate;   Fair Recent;   Fair Remote;   Fair  Judgement:  Fair  Insight:  Fair  Psychomotor Activity:  Normal  Concentration:  Concentration: Fair and Attention Span: Fair  Recall:  [employer] of Knowledge: Fair  Language: Fair  Akathisia:  No  Handed:  Right  AIMS (if indicated): not done  Assets:  Communication Skills Desire for Improvement Housing Intimacy Talents/Skills Transportation  ADL's:  Intact  Cognition: WNL  Sleep:  Fair  Screenings: GAD-7    Flowsheet Row Office Visit from 05/23/2022 in [facility] Office Visit from 04/24/2022 in [facility] Office Visit from 03/19/2022 in [facility] Office Visit from 10/02/2021 in [facility] Office Visit from 01/30/2021 in [facility]  Total GAD-7 Score 10 14 20 13 13       PHQ2-9    Flowsheet Row Office Visit from 05/23/2022 in [facility] Office Visit from 04/24/2022 in [facility] Office Visit from 03/19/2022 in [facility] Office Visit from 10/02/2021 in [facility] Office Visit from 01/30/2021 in [facility]  PHQ-2 Total Score 0 1 3 0 2  PHQ-9 Total Score 4 11 18 4 8       Flowsheet Row Office Visit from 05/23/2022 in [facility] Office Visit from 04/24/2022 in [facility]  C-SSRS RISK CATEGORY No Risk No Risk        Assessment and Plan: Jeffery Gomez is a 36 year old Caucasian male, married, employed, lives in [place],  has a history of anxiety, attention and focus deficit, excessive daytime sleepiness, presented for medication management.  Patient is tolerating the venlafaxine well, however will benefit from dosage increase.  Plan as noted below.  Plan Anxiety disorder unspecified-unstable Increase venlafaxine extended release to 75 mg p.o. daily Referral for CBT-provided resources in the community.  Attention and concentration deficit-unstable Pending ADHD testing-referred to Dr.Altabet  Caffeine use disorder likely mild-unstable Provided education, Patient is cutting back.  Tobacco use disorder-unstable Provided counseling for 1 minute.  Sleep study completed-pending report.  Follow-up in clinic in 6 to 8 weeks or sooner if needed.  This note was generated in part or whole with voice recognition software. Voice recognition is usually quite accurate but there are transcription errors that can and very often do occur. I apologize for any typographical errors that were not detected and corrected.       31, MD 05/24/2022, 8:06 AM

## 2022-05-24 ENCOUNTER — Encounter: Payer: Self-pay | Admitting: Psychiatry

## 2022-06-14 ENCOUNTER — Other Ambulatory Visit: Payer: Self-pay | Admitting: Psychiatry

## 2022-06-14 DIAGNOSIS — F419 Anxiety disorder, unspecified: Secondary | ICD-10-CM

## 2022-07-15 ENCOUNTER — Other Ambulatory Visit: Payer: Self-pay | Admitting: Psychiatry

## 2022-07-15 DIAGNOSIS — F419 Anxiety disorder, unspecified: Secondary | ICD-10-CM

## 2022-07-23 ENCOUNTER — Encounter: Payer: Self-pay | Admitting: Psychiatry

## 2022-07-23 ENCOUNTER — Ambulatory Visit (INDEPENDENT_AMBULATORY_CARE_PROVIDER_SITE_OTHER): Payer: BC Managed Care – PPO | Admitting: Psychiatry

## 2022-07-23 VITALS — BP 109/75 | HR 86 | Temp 98.5°F | Wt 172.8 lb

## 2022-07-23 DIAGNOSIS — F411 Generalized anxiety disorder: Secondary | ICD-10-CM

## 2022-07-23 DIAGNOSIS — F1590 Other stimulant use, unspecified, uncomplicated: Secondary | ICD-10-CM | POA: Diagnosis not present

## 2022-07-23 DIAGNOSIS — R41840 Attention and concentration deficit: Secondary | ICD-10-CM | POA: Diagnosis not present

## 2022-07-23 DIAGNOSIS — F17200 Nicotine dependence, unspecified, uncomplicated: Secondary | ICD-10-CM | POA: Diagnosis not present

## 2022-07-23 NOTE — Patient Instructions (Signed)
[email or web address] [facility] [email or web address] - [phone]  [facility] [address] [place], [place], [place] [address]  ~51.7 mi [phone]    [facility] [phone]   [facility] [phone]  [facility] - [phone]

## 2022-07-23 NOTE — Progress Notes (Unsigned)
BH MD OP Progress Note  07/23/2022 5:26 PM Jeffery Gomez  MRN:  [id number]  Chief Complaint:  Chief Complaint  Patient presents with   Follow-up: 36 year old Caucasian male with history of anxiety, attention and focus deficit, presented for medication management.   HPI: Jeffery Gomez is a 36 year old Caucasian male, married, employed, lives in [place] has a history of anxiety, excessive sleepiness, attention and focus deficit, was evaluated in office today.  Patient today presented late for his appointment.  Patient today reports he has noticed some good benefits on the venlafaxine.  However when he missed a dose he had severe withdrawal symptoms, electric zaps all over his body.  Patient reports he continues to struggle with excessive sleepiness, lack of motivation during the day.  Reports he needs to have a coffee or caffeinated drink in the morning to get himself going.  He however has been trying to limit the use of caffeine.  Patient reports when he drinks a lot of caffeine during the day, by the end of the day he has extreme anxiety and feels on edge.  He hence has been cutting back.  Patient reports sleep is overall okay during the night.  However he wakes up feeling not rested.  He also feels stuffy due to congestion and seasonal allergies.  He did have a sleep study completed recently, and per review of sleep study report in EHR dated in June 2023 patient likely has mild sleep apnea.  Patient to follow up with pulmonology.  Patient denies any suicidality, homicidality or perceptual disturbances.  Continues to have attention and focus deficit, unknown if this is likely due to his sleep apnea, he was referred to Dr. Bryson Dames, he has not scheduled an appointment yet.  Patient denies any other concerns today.  Visit Diagnosis:    ICD-10-CM   1. Generalized anxiety disorder  F41.1     2. Attention and concentration deficit  R41.840     3. Caffeine use disorder   F15.90     4. Tobacco use disorder  F17.200       Past Psychiatric History: Reviewed past psychiatric history from progress note on 04/24/2022.  Past trials of medications like BuSpar-side effects, Depakote-side effects, Lexapro, Zyprexa.  Past Medical History:  Past Medical History:  Diagnosis Date   Anxiety    Bipolar disorder (HCC)    Panic attacks     Past Surgical History:  Procedure Laterality Date   NO PAST SURGERIES      Family Psychiatric History: Reviewed family psychiatric history from progress note on 04/24/2022.  Family History:  Family History  Problem Relation Age of Onset   Anxiety disorder Mother    Panic disorder Mother    Bipolar disorder Father    Schizophrenia Father    Alcohol abuse Brother    Bipolar disorder Brother    ADD / ADHD Brother    Anxiety disorder Brother    Bipolar disorder Maternal Aunt    Alcohol abuse Maternal Grandfather    Cirrhosis Maternal Grandfather    Heart attack Maternal Grandmother    Bipolar disorder Paternal Grandmother    Thyroid disease Paternal Grandmother     Social History: Reviewed social history from progress note on 04/24/2022. Social History   Socioeconomic History   Marital status: Married    Spouse name: Chelsie   Number of children: 3   Years of education: 11 TH GRADE   Highest education level: Not on file  Occupational History  Not on file  Tobacco Use   Smoking status: Some Days    Types: Cigarettes   Smokeless tobacco: Current    Types: Chew   Tobacco comments:    1 pack per week--03/20/2022  Vaping Use   Vaping Use: Never used  Substance and Sexual Activity   Alcohol use: Yes    Comment: occasinal   Drug use: No   Sexual activity: Yes    Partners: Female  Other Topics Concern   Not on file  Social History Narrative   Not on file   Social Determinants of Health   Financial Resource Strain: Low Risk  (04/22/2019)   Overall Financial Resource Strain (CARDIA)    Difficulty of Paying Living  Expenses: Not hard at all  Food Insecurity: No Food Insecurity (04/22/2019)   Hunger Vital Sign    Worried About Running Out of Food in the Last Year: Never true    Ran Out of Food in the Last Year: Never true  Transportation Needs: No Transportation Needs (04/22/2019)   PRAPARE - Administrator, Civil Service (Medical): No    Lack of Transportation (Non-Medical): No  Physical Activity: Inactive (04/22/2019)   Exercise Vital Sign    Days of Exercise per Week: 0 days    Minutes of Exercise per Session: 0 min  Stress: No Stress Concern Present (04/22/2019)   [employer] of Occupational Health - Occupational Stress Questionnaire    Feeling of Stress : Only a little  Social Connections: Somewhat Isolated (04/22/2019)   Social Connection and Isolation Panel [NHANES]    Frequency of Communication with Friends and Family: More than three times a week    Frequency of Social Gatherings with Friends and Family: More than three times a week    Attends Religious Services: Never    Database administrator or Organizations: No    Attends Banker Meetings: Never    Marital Status: Married    Allergies:  Allergies  Allergen Reactions   Depakote [Divalproex Sodium] Anaphylaxis    Metabolic Disorder Labs: No results found for: "HGBA1C", "MPG" No results found for: "PROLACTIN" No results found for: "CHOL", "TRIG", "HDL", "CHOLHDL", "VLDL", "LDLCALC" Lab Results  Component Value Date   TSH 1.12 03/19/2022   TSH 1.03 12/30/2017    Therapeutic Level Labs: No results found for: "LITHIUM" No results found for: "VALPROATE" No results found for: "CBMZ"  Current Medications: Current Outpatient Medications  Medication Sig Dispense Refill   venlafaxine XR (EFFEXOR-XR) 75 MG 24 hr capsule TAKE 1 CAPSULE BY MOUTH DAILY WITH BREAKFAST. 90 capsule 0   No current facility-administered medications for this visit.     Musculoskeletal: Strength & Muscle Tone: within normal  limits Gait & Station: normal Patient leans: N/A  Psychiatric Specialty Exam: Review of Systems  Constitutional:  Positive for fatigue.  Psychiatric/Behavioral:  Positive for sleep disturbance. The patient is nervous/anxious.   All other systems reviewed and are negative.   Blood pressure 109/75, pulse 86, temperature 98.5 F (36.9 C), temperature source Temporal, weight 172 lb 12.8 oz (78.4 kg).Body mass index is 24.79 kg/m.  General Appearance: Casual  Eye Contact:  Fair  Speech:  Clear and Coherent  Volume:  Normal  Mood:  Anxious  Affect:  Congruent  Thought Process:  Goal Directed and Descriptions of Associations: Intact  Orientation:  Full (Time, Place, and Person)  Thought Content: Logical   Suicidal Thoughts:  No  Homicidal Thoughts:  No  Memory:  Immediate;  Fair Recent;   Fair Remote;   Fair  Judgement:  Fair  Insight:  Fair  Psychomotor Activity:  Normal  Concentration:  Concentration: Fair and Attention Span: Fair  Recall:  [employer] of Knowledge: Fair  Language: Fair  Akathisia:  No  Handed:  Right  AIMS (if indicated): not done  Assets:  Communication Skills Desire for Improvement Housing Intimacy Resilience Social Support Talents/Skills Transportation Vocational/Educational  ADL's:  Intact  Cognition: WNL  Sleep:   excessive   Screenings: GAD-7    Flowsheet Row Office Visit from 07/23/2022 in [facility] Office Visit from 05/23/2022 in [facility] Office Visit from 04/24/2022 in [facility] Office Visit from 03/19/2022 in [facility] Office Visit from 10/02/2021 in [facility]  Total GAD-7 Score 11 10 14 20 13       PHQ2-9    Flowsheet Row Office Visit from 07/23/2022 in [facility] Office Visit from 05/23/2022 in [facility] Office Visit from 04/24/2022 in [facility] Office Visit from 03/19/2022 in [facility] Office Visit from 10/02/2021 in [facility]  PHQ-2 Total Score 2 0 1 3 0  PHQ-9 Total Score 9 4 11 18 4       Flowsheet Row Office Visit from 07/23/2022 in [facility] Office Visit from 05/23/2022 in [facility] Office Visit from 04/24/2022 in [facility]  C-SSRS RISK CATEGORY No Risk No Risk No Risk        Assessment and Plan: JANCE SIEK is a 36 year old Caucasian male, married, employed, lives in [place], has a history of anxiety, attention and focus deficit, excessive daytime sleepiness, presented for medication management.  Patient is currently improving although continues to have sleep problems as noted above, fatigue, concentration problems, will benefit from the following plan.  Plan GAD-improving Continue venlafaxine extended release 75 mg p.o. daily Patient was referred for CBT-provided community resources again since he has been noncompliant.  Attention and concentration deficit-unstable Patient was referred for ADHD testing-Dr. Altabet-pending.  Caffeine use disorder likely mild-unstable Provided counseling, currently cutting back.  Tobacco use disorder-unstable Will monitor closely  Patient had sleep study completed-per review of sleep study report in EHR dated June 2023-patient with mild sleep apnea.  Patient advised to follow up with pulmonologist for further management.  Follow-up in clinic in 2 to 3 months or sooner if needed.   This note was generated in part or whole with voice recognition software. Voice recognition is usually quite accurate but there are transcription errors that can and very often do occur. I apologize for any typographical errors that were not detected and corrected.      [place], MD 07/23/2022, 5:26 PM

## 2022-07-28 NOTE — Progress Notes (Unsigned)
Virtual Visit via Video Note  I connected with Jeffery Gomez on 07/28/22 at 11:30 AM EDT by a video enabled telemedicine application and verified that I am speaking with the correct person using two identifiers.  Location: Patient: Home Provider: Office   I discussed the limitations of evaluation and management by telemedicine and the availability of in person appointments. The patient expressed understanding and agreed to proceed.  History of Present Illness: 36 year old male, current someday smoker.  Past medical history significant for generalized anxiety disorder, bipolar disorder, tobacco use.  Previous [facility] pulmonary encounter:  03/20/2022 Patient presents today for sleep consult. He reports symptoms of non-restorative sleep. He has on occasion woken up gasping for air. His wife has told him that he snores. He does not feel well rested when he wakes up in the morning. He has associated daytime sleepiness/fatigue. Typical bedtime is between 10-11pm. He has no trouble falling or staying asleep. He sleeps better and feels more rested when he falls asleep on the couch laying on his side. He starts his day at 6-6:30am. He has chronic sinus congestion, recently started taking xyzal two days ago and will use OTC nasal spray as needed. He is an occasional smoker, 1 pack of cigarettes will last him a week. He has rare shortness of breath symptoms. Denies narcolepsy, cataplexy or sleep walking.   Sleep questionnaire Symptoms-occasional snoring/wakes up gasping for air, nonrestorative sleep Sleep study-none prior Bedtime- 10-11pm Time to fall asleep- 30 mins Nocturnal awakenings- zero Start day- 6-6:30am Do you operate heavy machinery- No Weight changes in 2 years- 20 lbs Do you wear cpap- non Do you wear oxygen- no [place]-11   07/30/2022- Interim hx Patient contacted today to review sleep study results.  Patient has symptoms of occasional snoring, wakes up gasping for air and  nonrestorative sleep.  HST 05/04/22 showed mild OSA, AHI 7.6/hour. Reviewed sleep study results with patient. He mainly sleeps on his back, he does have an adjustable bed frame. He sleeps better when in recliner. He will consider oral appliance for treatment of OSA in addition to positional sleep.   Observations/Objective:  - Appears well; No overt respiratory symptoms  - Home sleep study 05/04/22>> AHI 7.6/hour with SpO2 low 84% (average 93%).  Assessment and Plan:  Mild sleep apnea: - Home sleep study 05/04/22>> AHI 7.6/hour with SpO2 low 84% (average 93%). Apneas were mainly obstructive. Advised patient to focus on positional sleep, recommend he avoid back sleeping position or elevate HOB 30 degrees. We will also refer him to orthodontics for consideration for oral appliance to help with snoring and mild apneas. He does not need CPAP right now unless his sleep symptoms worsen.  Advised against driving if experiencing excessive daytime sleepiness or fatigue.  Follow Up Instructions:   - 6 months with Beth  I discussed the assessment and treatment plan with the patient. The patient was provided an opportunity to ask questions and all were answered. The patient agreed with the plan and demonstrated an understanding of the instructions.   The patient was advised to call back or seek an in-person evaluation if the symptoms worsen or if the condition fails to improve as anticipated.  I provided 22 minutes of non-face-to-face time during this encounter.   Glenford Bayley, NP

## 2022-07-30 ENCOUNTER — Telehealth (INDEPENDENT_AMBULATORY_CARE_PROVIDER_SITE_OTHER): Payer: BC Managed Care – PPO | Admitting: Primary Care

## 2022-07-30 ENCOUNTER — Encounter: Payer: Self-pay | Admitting: Primary Care

## 2022-07-30 DIAGNOSIS — G4730 Sleep apnea, unspecified: Secondary | ICD-10-CM

## 2022-07-30 NOTE — Progress Notes (Signed)
Reviewed and agree with assessment/plan.   Brentlee Delage, MD  Pulmonary/Critical Care 07/30/2022, 12:35 PM Pager:  [phone]  

## 2022-07-30 NOTE — Patient Instructions (Addendum)
Home sleep study showed evidence of mild obstructive sleep apnea, on average you had 7.6 apneic events an hour  Risks of untreated sleep apnea include cardiac arrhythmias, pulmonary hypertension, stroke or diabetes  Treatment options include weight loss, oral appliance, CPAP therapy or referral to ENT for possible surgical options  Due to mild severity of your sleep apnea recommend trying positional sleep and consider oral appliance.  If sleep symptoms persist or worsen we could try CPAP if you are very symptomatic  Referral Orthodontic for Irene Limbo  [phone]  Follow-up 6 months with Waynetta Sandy NP- Virtual visit ok

## 2022-08-17 ENCOUNTER — Other Ambulatory Visit: Payer: Self-pay | Admitting: Psychiatry

## 2022-08-17 DIAGNOSIS — F419 Anxiety disorder, unspecified: Secondary | ICD-10-CM

## 2022-08-17 DIAGNOSIS — R41840 Attention and concentration deficit: Secondary | ICD-10-CM

## 2022-09-19 ENCOUNTER — Other Ambulatory Visit: Payer: Self-pay

## 2022-09-19 ENCOUNTER — Ambulatory Visit: Payer: BC Managed Care – PPO | Admitting: Nurse Practitioner

## 2022-09-19 ENCOUNTER — Encounter: Payer: Self-pay | Admitting: Nurse Practitioner

## 2022-09-19 VITALS — BP 124/72 | HR 98 | Temp 98.1°F | Resp 18 | Ht 69.0 in | Wt 177.7 lb

## 2022-09-19 DIAGNOSIS — L409 Psoriasis, unspecified: Secondary | ICD-10-CM

## 2022-09-19 DIAGNOSIS — F411 Generalized anxiety disorder: Secondary | ICD-10-CM

## 2022-09-19 DIAGNOSIS — R6889 Other general symptoms and signs: Secondary | ICD-10-CM

## 2022-09-19 DIAGNOSIS — R41840 Attention and concentration deficit: Secondary | ICD-10-CM | POA: Diagnosis not present

## 2022-09-19 DIAGNOSIS — F419 Anxiety disorder, unspecified: Secondary | ICD-10-CM | POA: Diagnosis not present

## 2022-09-19 DIAGNOSIS — F331 Major depressive disorder, recurrent, moderate: Secondary | ICD-10-CM | POA: Diagnosis not present

## 2022-09-19 DIAGNOSIS — G4730 Sleep apnea, unspecified: Secondary | ICD-10-CM | POA: Diagnosis not present

## 2022-09-19 NOTE — Assessment & Plan Note (Signed)
Follow-up with pulmonology regarding next steps.  Continue to avoid sleeping on back

## 2022-09-19 NOTE — Assessment & Plan Note (Signed)
Patient is currently seeing Dr. Shea Evans and is currently taking venlafaxine but does not like it.  He has an appointment with her on September 24, 2022.  Recommend patient discuss changing medication with psychiatry.

## 2022-09-19 NOTE — Progress Notes (Signed)
BP 124/72   Pulse 98   Temp 98.1 F (36.7 C) (Oral)   Resp 18   Ht _0  (1.753 m)   Wt 177 lb 11.2 oz (80.6 kg)   SpO2 98%   BMI 26.24 kg/m    Subjective:    Patient ID: Jeffery Gomez, male    DOB: 02-Apr-1986, 36 y.o.   MRN: [id number]  HPI: Jeffery Gomez is a 36 y.o. male, here alone  Chief Complaint  Patient presents with   Anxiety    6 month follow up   Depression   Anxiety and Depression: Patient has established with psychiatry and was last seen on 07/23/2022 by Dr. Shea Evans. Patient currently taking venlafaxine extended release 75 mg daily.  Patient reports he is not liking this medication and wants to change. Patient reports he has an appointment with Dr. Shea Evans coming up on 09/24/2022. Recommend he follow up with her regarding changing his medication. Pt verbalized understanding. Patient also has not had time to reach out for counseling.  Patient states he is going to call around and find a counselor who can do it virtually.      09/19/2022    1:11 PM 07/23/2022    4:13 PM 07/23/2022    3:53 PM 05/23/2022    3:57 PM 04/24/2022    9:36 AM  Depression screen PHQ 2/9  Decreased Interest _1 0 0  Down, Depressed, Hopeless _2 0 1  PHQ - 2 Score _3 0 1  Altered sleeping _4 0 1  Tired, decreased energy _5 Change in appetite 0 _6 Feeling bad or failure about yourself  _7 0 1  Trouble concentrating _8 0 2  Moving slowly or fidgety/restless 0 _9 Suicidal thoughts 0 1 1 0 0  PHQ-9 Score _10 Difficult doing work/chores Somewhat difficult Somewhat difficult   Somewhat difficult       09/19/2022    1:11 PM 07/23/2022    4:14 PM 07/23/2022    3:53 PM 05/23/2022    4:13 PM  GAD 7 : Generalized Anxiety Score  Nervous, Anxious, on Edge _11 Control/stop worrying _12 Worry too much - different things _13 Trouble relaxing _14 Restless _15 Easily annoyed or irritable _16 Afraid - awful might happen _17 0  Total GAD 7 Score _18 Anxiety Difficulty Somewhat difficult Somewhat difficult Somewhat difficult Not difficult at all    Inattention: Patient was referred by his psychiatrist to Dr. Lurline Hare for ADHD testing.  Patient reports has not set up an appointment yet.  Patient reports his psychiatrist told him to wait till after the sleep study.   Mild sleep apnea:  patient was seen by pulmonology and had sleep study done. He was told he has mild sleep apnea but not requiring cpap at this time.  Notes from pulmonology:  Home sleep study 05/04/22>> AHI 7.6/hour with SpO2 low 84% (average 93%). Apneas were mainly obstructive. Advised patient to focus on positional sleep, recommend he avoid back sleeping position or elevate HOB 30 degrees. We will also refer him to orthodontics for consideration for oral appliance to help with snoring and mild apneas. He does not need  CPAP right now unless his sleep symptoms worsen.  Advised against driving if experiencing excessive daytime sleepiness or fatigue.  Patient reports he went to a orthodontist to get the mouthguard and the mouthguard was $4000.  Patient decided against getting a mouthguard.  Patient states he did get some things that go up his nose to help open his airway off [place].  Patient states he has noticed a difference with that.  Patient also tries really hard to sleep on his side as opposed to on his back.  Patient is going to reach out to pulmonology about what the next steps are.  Sinus issues:  He says he has been having issues with his sinuses for awhile.  He says he feels like his nasal passages are not as open as they should be and he says he feels like his sinuses are always dry.  He would like to see ENT.  Will place referral.   Psoriasis: Patient reports he gets psoriasis on bilateral knees and he has a cream that he puts on that.  Patient's complaint now is that he has noticed that he has scalp pain does not go there.  Patient states he does  have a shampoo at home but has not tried it yet patient is going to try that also discussed that up-to-date  recommends coal tar shampoo.  Patient is going to try that while he is waiting for his referral to dermatology.  Relevant past medical, surgical, family and social history reviewed and updated as indicated. Interim medical history since our last visit reviewed. Allergies and medications reviewed and updated.  Review of Systems Constitutional: Negative for fever or weight change.  Respiratory: Negative for cough and shortness of breath.   Cardiovascular: Negative for chest pain or palpitations.  Gastrointestinal: Negative for abdominal pain, no bowel changes.  Musculoskeletal: Negative for gait problem or joint swelling.  Skin: Negative for rash.  Positive for psoriasis of the scalp Neurological: Negative for dizziness or headache.  No other specific complaints in a complete review of systems (except as listed in HPI above).      Objective:    BP 124/72   Pulse 98   Temp 98.1 F (36.7 C) (Oral)   Resp 18   Ht _0  (1.753 m)   Wt 177 lb 11.2 oz (80.6 kg)   SpO2 98%   BMI 26.24 kg/m   Wt Readings from Last 3 Encounters:  09/19/22 177 lb 11.2 oz (80.6 kg)  07/23/22 172 lb 12.8 oz (78.4 kg)  05/23/22 169 lb (76.7 kg)    Physical Exam  Constitutional: Patient appears well-developed and well-nourished.  No distress.  HEENT: head atraumatic, normocephalic, pupils equal and reactive to light, neck supple Cardiovascular: Normal rate, regular rhythm and normal heart sounds.  No murmur heard. No BLE edema. Pulmonary/Chest: Effort normal and breath sounds normal. No respiratory distress. Abdominal: Soft.  There is no tenderness. Psychiatric: Patient has a normal mood and affect. behavior is normal. Judgment and thought content normal.   Results for orders placed or performed in visit on 03/19/22  CBC with Differential/Platelet  Result Value Ref Range   WBC 9.3 3.8 - 10.8  Thousand/uL   RBC 5.15 4.20 - 5.80 Million/uL   Hemoglobin 15.0 13.2 - 17.1 g/dL   HCT 43.9 38.5 - 50.0 %   MCV 85.2 80.0 - 100.0 fL   MCH 29.1 27.0 - 33.0 pg   MCHC 34.2 32.0 - 36.0 g/dL   RDW 12.7 11.0 -  15.0 %   Platelets 331 140 - 400 Thousand/uL   MPV 9.4 7.5 - 12.5 fL   Neutro Abs 5,543 1,500 - 7,800 cells/uL   Lymphs Abs 2,660 850 - 3,900 cells/uL   Absolute Monocytes 846 200 - 950 cells/uL   Eosinophils Absolute 167 15 - 500 cells/uL   Basophils Absolute 84 0 - 200 cells/uL   Neutrophils Relative % 59.6 %   Total Lymphocyte 28.6 %   Monocytes Relative 9.1 %   Eosinophils Relative 1.8 %   Basophils Relative 0.9 %  COMPLETE METABOLIC PANEL WITH GFR  Result Value Ref Range   Glucose, Bld 85 65 - 99 mg/dL   BUN 13 7 - 25 mg/dL   Creat 0.79 0.60 - 1.26 mg/dL   eGFR 118 > OR = 60 mL/min/1.43m   BUN/Creatinine Ratio NOT APPLICABLE 6 - 22 (calc)   Sodium 140 135 - 146 mmol/L   Potassium 5.0 3.5 - 5.3 mmol/L   Chloride 102 98 - 110 mmol/L   CO2 30 20 - 32 mmol/L   Calcium 10.2 8.6 - 10.3 mg/dL   Total Protein 7.6 6.1 - 8.1 g/dL   Albumin 4.8 3.6 - 5.1 g/dL   Globulin 2.8 1.9 - 3.7 g/dL (calc)   AG Ratio 1.7 1.0 - 2.5 (calc)   Total Bilirubin 0.8 0.2 - 1.2 mg/dL   Alkaline phosphatase (APISO) 49 36 - 130 U/L   AST 14 10 - 40 U/L   ALT 10 9 - 46 U/L  TSH  Result Value Ref Range   TSH 1.12 0.40 - 4.50 mIU/L      Assessment & Plan:   Problem List Items Addressed This Visit       Respiratory   Sleep apnea    Follow-up with pulmonology regarding next steps.  Continue to avoid sleeping on back        Musculoskeletal and Integument   Psoriasis    Referral placed to dermatology.  May start using coal tar shampoo.      Relevant Orders   Ambulatory referral to Dermatology     Other   Anxiety disorder - Primary    Patient is currently seeing Dr. EShea Evansand is currently taking venlafaxine but does not like it.  He has an appointment with her on September 24, 2022.   Recommend patient discuss changing medication with psychiatry.      Moderate episode of recurrent major depressive disorder ([facility]    Patient is currently seeing Dr. EShea Evansand is currently taking venlafaxine but does not like it.  He has an appointment with her on September 24, 2022.  Recommend patient discuss changing medication with psychiatry.      Other Visit Diagnoses     Inattention       Patient has information to schedule appointment with Dr. ALurline Harefor testing   Nasal airway abnormality       Referral placed to ENT.   Relevant Orders   Ambulatory referral to ENT        Follow up plan: Return in about 6 months (around 03/20/2023) for cpe.

## 2022-09-19 NOTE — Assessment & Plan Note (Signed)
Patient is currently seeing Dr. Eappen and is currently taking venlafaxine but does not like it.  He has an appointment with her on September 24, 2022.  Recommend patient discuss changing medication with psychiatry. 

## 2022-09-19 NOTE — Assessment & Plan Note (Signed)
Referral placed to dermatology.  May start using coal tar shampoo.

## 2022-09-24 ENCOUNTER — Ambulatory Visit: Payer: BC Managed Care – PPO | Admitting: Psychiatry

## 2022-10-22 ENCOUNTER — Other Ambulatory Visit: Payer: Self-pay | Admitting: Psychiatry

## 2022-10-22 DIAGNOSIS — F419 Anxiety disorder, unspecified: Secondary | ICD-10-CM

## 2022-11-13 ENCOUNTER — Encounter: Payer: Self-pay | Admitting: Psychiatry

## 2022-11-13 ENCOUNTER — Telehealth (INDEPENDENT_AMBULATORY_CARE_PROVIDER_SITE_OTHER): Payer: BC Managed Care – PPO | Admitting: Psychiatry

## 2022-11-13 DIAGNOSIS — F17200 Nicotine dependence, unspecified, uncomplicated: Secondary | ICD-10-CM

## 2022-11-13 DIAGNOSIS — F1590 Other stimulant use, unspecified, uncomplicated: Secondary | ICD-10-CM

## 2022-11-13 DIAGNOSIS — R41840 Attention and concentration deficit: Secondary | ICD-10-CM | POA: Diagnosis not present

## 2022-11-13 DIAGNOSIS — F411 Generalized anxiety disorder: Secondary | ICD-10-CM

## 2022-11-13 DIAGNOSIS — F17210 Nicotine dependence, cigarettes, uncomplicated: Secondary | ICD-10-CM

## 2022-11-13 DIAGNOSIS — Z91199 Patient's noncompliance with other medical treatment and regimen due to unspecified reason: Secondary | ICD-10-CM

## 2022-11-13 MED ORDER — ESCITALOPRAM OXALATE 20 MG PO TABS: 20.0000 mg | ORAL_TABLET | Freq: Every day | ORAL | 1 refills | Status: DC

## 2022-11-13 NOTE — Progress Notes (Signed)
Virtual Visit via Video Note  I connected with Jeffery Gomez on 11/13/22 at  4:00 PM EST by a video enabled telemedicine application and verified that I am speaking with the correct person using two identifiers.  Location Provider Location : [facility] Patient Location : Work  Participants: Patient , Provider    I discussed the limitations of evaluation and management by telemedicine and the availability of in person appointments. The patient expressed understanding and agreed to proceed.   I discussed the assessment and treatment plan with the patient. The patient was provided an opportunity to ask questions and all were answered. The patient agreed with the plan and demonstrated an understanding of the instructions.   The patient was advised to call back or seek an in-person evaluation if the symptoms worsen or if the condition fails to improve as anticipated.   BH MD OP Progress Note  11/14/2022 10:37 AM Jeffery Gomez  MRN:  [id number]  Chief Complaint:  Chief Complaint  Patient presents with   Follow-up   Anxiety   Medication Refill   attention problem   HPI: Jeffery Gomez is a 36 year old Caucasian male, married, employed, lives in [place], has a history of GAD, attention and focus deficit, caffeine use disorder, tobacco use disorder, excessive sleepiness, was evaluated by telemedicine today.  Patient's last visit was on 07/23/2022.  Patient today connected late for the appointment.  Patient today reports he is having a lot of trouble keeping up with his job and other things that he has to do.  He reports he was finally able to get sleep study completed.  He was told he has mild sleep apnea however he is unable to order the mouthpiece since it is very expensive.  He continues to have excessive sleepiness, tiredness, 'brain fog', concentration problem.  Patient reports he struggles with easy distractibility and inability to stay on task.  Patient also reports he  struggles with anxiety, worries a lot, is often anxious, feels restless inside all the time.  Patient reports he hence has been struggling.  He could not take the venlafaxine since he had side effects to it.  He hence decided to taper himself off of it and started taking the Lexapro back.  Reports he currently takes Lexapro 10 mg.  Agreeable to dosage increase to 20 mg.  Patient denies any suicidality, homicidality or perceptual disturbances.  Continues to use caffeine, motivated to cut back.  Patient has been unable to complete his neuropsychological testing to which he was referred.  Agrees to get it done.  Patient also has been noncompliant with psychotherapy.  Agreeable to be placed on a wait list with our new therapist.  Visit Diagnosis:    ICD-10-CM   1. Generalized anxiety disorder  F41.1 escitalopram (LEXAPRO) 20 MG tablet    2. Attention and concentration deficit  R41.840     3. 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 Tobacco use disorder  F17.200     5. Noncompliance with treatment plan  Z91.199       Past Psychiatric History: Reviewed past psychiatric history from progress note on 04/24/2022.  Past trials of medications like BuSpar-side effects, Depakote-side effect, Lexapro, Zyprexa.  Past Medical History:  Past Medical History:  Diagnosis Date   Anxiety    Bipolar disorder (HCC)    Panic attacks     Past Surgical History:  Procedure Laterality Date   NO PAST SURGERIES      Family Psychiatric History:  Reviewed family psychiatric history from progress note on 04/24/2022.  Family History:  Family History  Problem Relation Age of Onset   Anxiety disorder Mother    Panic disorder Mother    Bipolar disorder Father    Schizophrenia Father    Alcohol abuse Brother    Bipolar disorder Brother    ADD / ADHD Brother    Anxiety disorder Brother    Bipolar disorder Maternal Aunt    Alcohol abuse Maternal Grandfather    Cirrhosis Maternal Grandfather    Heart attack  Maternal Grandmother    Bipolar disorder Paternal Grandmother    Thyroid disease Paternal Grandmother     Social History: Reviewed social history from progress note on 04/24/2022. Social History   Socioeconomic History   Marital status: Married    Spouse name: Chelsie   Number of children: 3   Years of education: 11 TH GRADE   Highest education level: Not on file  Occupational History   Not on file  Tobacco Use   Smoking status: Some Days    Types: Cigarettes   Smokeless tobacco: Current    Types: Chew   Tobacco comments:    1 pack per week--03/20/2022  Vaping Use   Vaping Use: Never used  Substance and Sexual Activity   Alcohol use: Yes    Comment: occasinal   Drug use: No   Sexual activity: Yes    Partners: Female  Other Topics Concern   Not on file  Social History Narrative   Not on file   Social Determinants of Health   Financial Resource Strain: Low Risk  (04/22/2019)   Overall Financial Resource Strain (CARDIA)    Difficulty of Paying Living Expenses: Not hard at all  Food Insecurity: No Food Insecurity (04/22/2019)   Hunger Vital Sign    Worried About Running Out of Food in the Last Year: Never true    Ran Out of Food in the Last Year: Never true  Transportation Needs: No Transportation Needs (04/22/2019)   PRAPARE - Administrator, Civil ServiceTransportation    Lack of Transportation (Medical): No    Lack of Transportation (Non-Medical): No  Physical Activity: Inactive (04/22/2019)   Exercise Vital Sign    Days of Exercise per Week: 0 days    Minutes of Exercise per Session: 0 min  Stress: No Stress Concern Present (04/22/2019)   [employer] - Occupational Stress Questionnaire    Feeling of Stress : Only a little  Social Connections: Somewhat Isolated (04/22/2019)   Social Connection and Isolation Panel [NHANES]    Frequency of Communication with Friends and Family: More than three times a week    Frequency of Social Gatherings with Friends and Family: More than  three times a week    Attends Religious Services: Never    Database administratorActive Member of Clubs or Organizations: No    Attends BankerClub or Organization Meetings: Never    Marital Status: Married    Allergies:  Allergies  Allergen Reactions   Depakote [Divalproex Sodium] Anaphylaxis    Metabolic Disorder Labs: No results found for: "HGBA1C", "MPG" No results found for: "PROLACTIN" No results found for: "CHOL", "TRIG", "HDL", "CHOLHDL", "VLDL", "LDLCALC" Lab Results  Component Value Date   TSH 1.12 03/19/2022   TSH 1.03 12/30/2017    Therapeutic Level Labs: No results found for: "LITHIUM" No results found for: "VALPROATE" No results found for: "CBMZ"  Current Medications: Current Outpatient Medications  Medication Sig Dispense Refill   escitalopram (LEXAPRO) 20 MG tablet  Take 1 tablet (20 mg total) by mouth daily. 30 tablet 1   No current facility-administered medications for this visit.     Musculoskeletal: Strength & Muscle Tone:  UTA Gait & Station: normal Patient leans: N/A  Psychiatric Specialty Exam: Review of Systems  Psychiatric/Behavioral:  Positive for decreased concentration and sleep disturbance. The patient is nervous/anxious.   All other systems reviewed and are negative.   There were no vitals taken for this visit.There is no height or weight on file to calculate BMI.  General Appearance: Casual  Eye Contact:  Fair  Speech:  Clear and Coherent  Volume:  Normal  Mood:  Anxious  Affect:  Congruent  Thought Process:  Goal Directed and Descriptions of Associations: Intact  Orientation:  Full (Time, Place, and Person)  Thought Content: Logical   Suicidal Thoughts:  No  Homicidal Thoughts:  No  Memory:  Immediate;   Fair Recent;   Fair Remote;   Fair  Judgement:  Fair  Insight:  Fair  Psychomotor Activity:  Normal  Concentration:  Concentration: Fair and Attention Span: Fair  Recall:  [employer] of Knowledge: Fair  Language: Fair  Akathisia:  No  Handed:   Right  AIMS (if indicated): not done  Assets:  Communication Skills Desire for Improvement Housing Talents/Skills Transportation  ADL's:  Intact  Cognition: WNL  Sleep:   restless   Screenings: GAD-7    Flowsheet Row Video Visit from 11/13/2022 in [facility] Office Visit from 09/19/2022 in [facility] Office Visit from 07/23/2022 in [facility] Office Visit from 05/23/2022 in [facility] Office Visit from 04/24/2022 in [facility]  Total GAD-7 Score 18 16 11 10 14       PHQ2-9    Flowsheet Row Video Visit from 11/13/2022 in [facility] Office Visit from 09/19/2022 in [facility] Office Visit from 07/23/2022 in [facility] Office Visit from 05/23/2022 in [facility] Office Visit from 04/24/2022 in [facility]  PHQ-2 Total Score 1 2 2  0 1  PHQ-9 Total Score -- 10 9 4 11       Flowsheet Row Video Visit from 11/13/2022 in [facility] Office Visit from 07/23/2022 in [facility] Office Visit from 05/23/2022 in [facility]  C-SSRS RISK CATEGORY No Risk No Risk No Risk        Assessment and Plan: CORT DRAGOO is a 36 year old Caucasian male, married, employed, lives in [place], has a history of generalized anxiety disorder, attention and focus problem, exercise daytime sleepiness, noncompliance with treatment recommendation presented for his appointment by telemedicine.  Patient is currently off of the venlafaxine due to side effects, back on Lexapro, agreeable to dosage increase, discussed plan as noted below.  Plan  GAD-unstable Discontinue venlafaxine for noncompliance, side effects. Increase Lexapro to 20 mg p.o. daily Referral for CBT  again, communicated with staff to schedule this patient with our incoming therapist.  Patient has not been able to schedule an appointment with therapist in the community yet.  Patient advised to call the clinic back in January to make sure the appointment with therapist is scheduled.  Attention and focus deficit-unstable Patient was referred for ADHD testing-with Dr.Altabet-patient encouraged to contact to schedule an appointment.  Patient has been noncompliant  Caffeine use disorder likely mild-improving Patient motivated to cut back  Tobacco use disorder-unstable Patient advised to cut  back.  Patient with recent diagnosis of mild sleep apnea, may benefit from recommendations for sleep provider to get mouthpiece since he continues to struggle with excessive sleepiness,' brain fog'.  Encouraged to do so.  Follow-up in clinic in 4 to 6 weeks or sooner if needed.    Collaboration of Care: Collaboration of Care: Referral or follow-up with counselor/therapist AEB encouraged to follow up with therapist and Other encouraged to follow up with sleep provider as well as neuropsychological testing  Patient/Guardian was advised Release of Information must be obtained prior to any record release in order to collaborate their care with an outside provider. Patient/Guardian was advised if they have not already done so to contact the registration department to sign all necessary forms in order for [place] to release information regarding their care.   Consent: Patient/Guardian gives verbal consent for treatment and assignment of benefits for services provided during this visit. Patient/Guardian expressed understanding and agreed to proceed.    Jomarie Longs, MD 11/14/2022, 10:37 AM

## 2022-11-19 ENCOUNTER — Other Ambulatory Visit: Payer: Self-pay | Admitting: Nurse Practitioner

## 2022-11-19 ENCOUNTER — Encounter: Payer: Self-pay | Admitting: Nurse Practitioner

## 2022-11-19 ENCOUNTER — Telehealth: Payer: BC Managed Care – PPO | Admitting: Nurse Practitioner

## 2022-11-19 DIAGNOSIS — J069 Acute upper respiratory infection, unspecified: Secondary | ICD-10-CM

## 2022-11-19 DIAGNOSIS — J40 Bronchitis, not specified as acute or chronic: Secondary | ICD-10-CM

## 2022-11-19 MED ORDER — FLUTICASONE PROPIONATE 50 MCG/ACT NA SUSP: 2.0000 | Freq: Every day | NASAL | 6 refills | Status: AC

## 2022-11-19 MED ORDER — BENZONATATE 100 MG PO CAPS: 100.0000 mg | ORAL_CAPSULE | Freq: Three times a day (TID) | ORAL | 0 refills | Status: DC | PRN

## 2022-11-19 MED ORDER — PSEUDOEPH-BROMPHEN-DM 30-2-10 MG/5ML PO SYRP: 5.0000 mL | ORAL_SOLUTION | Freq: Four times a day (QID) | ORAL | 0 refills | Status: DC | PRN

## 2022-11-19 MED ORDER — AZITHROMYCIN 250 MG PO TABS: ORAL_TABLET | ORAL | 0 refills | Status: AC

## 2022-11-19 NOTE — Progress Notes (Signed)
E visit for Flu like symptoms   We are sorry that you are not feeling well.  Here is how we plan to help! Based on what you have shared with me it looks like you may have a respiratory virus that may be influenza.  Influenza or "the flu" is   an infection caused by a respiratory virus. The flu virus is highly contagious and persons who did not receive their yearly flu vaccination may "catch" the flu from close contact.  We have anti-viral medications to treat the viruses that cause this infection. They are not a "cure" and only shorten the course of the infection. These prescriptions are most effective when they are given within the first 2 days of "flu" symptoms. Since it has been 4 days you are outside of the window for Tamiflu, we suggest managing your symptoms with the following medications:  Meds ordered this encounter  Medications   brompheniramine-pseudoephedrine-DM 30-2-10 MG/5ML syrup    Sig: Take 5 mLs by mouth 4 (four) times daily as needed for up to 7 days (cough ad congestion).    Dispense:  120 mL    Refill:  0    You should remain out of work until you have been fever free for 24 hours. We will attach a work note as well that will come through [employer] as a letter.    Based upon your symptoms and potential risk factors I recommend that you follow the flu symptoms recommendation that I have listed below.  ANYONE WHO HAS FLU SYMPTOMS SHOULD: Stay home. The flu is highly contagious and going out or to work exposes others! Be sure to drink plenty of fluids. Water is fine as well as fruit juices, sodas and electrolyte beverages. You may want to stay away from caffeine or alcohol. If you are nauseated, try taking small sips of liquids. How do you know if you are getting enough fluid? Your urine should be a pale yellow or almost colorless. Get rest. Taking a steamy shower or using a humidifier may help nasal congestion and ease sore throat pain. Using a saline nasal spray works much  the same way. Cough drops, hard candies and sore throat lozenges may ease your cough. Line up a caregiver. Have someone check on you regularly.   GET HELP RIGHT AWAY IF: You cannot keep down liquids or your medications. You become short of breath Your fell like you are going to pass out or loose consciousness. Your symptoms persist after you have completed your treatment plan MAKE SURE YOU  Understand these instructions. Will watch your condition. Will get help right away if you are not doing well or get worse.  Your e-visit answers were reviewed by a board certified advanced clinical practitioner to complete your personal care plan.  Depending on the condition, your plan could have included both over the counter or prescription medications.  If there is a problem please reply  once you have received a response from your provider.  Your safety is important to [place].  If you have drug allergies check your prescription carefully.    You can use MyChart to ask questions about today's visit, request a non-urgent call back, or ask for a work or school excuse for 24 hours related to this e-Visit. If it has been greater than 24 hours you will need to follow up with your provider, or enter a new e-Visit to address those concerns.  You will get an e-mail in the next two days asking  about your experience.  I hope that your e-visit has been valuable and will speed your recovery. Thank you for using e-visits.   I spent approximately 5 minutes reviewing the patient's history, current symptoms and coordinating their care today.

## 2022-11-19 NOTE — Addendum Note (Signed)
Addended by: Madilyn Hook on: 11/19/2022 07:31 PM   Modules accepted: Orders

## 2022-11-19 NOTE — Progress Notes (Signed)
Discussed with patient that cough and congestion are likely from viral URI, advised 1-2 more days of OTC management as discussed if no improvement may start antibiotics.

## 2022-12-17 DIAGNOSIS — L400 Psoriasis vulgaris: Secondary | ICD-10-CM | POA: Diagnosis not present

## 2022-12-26 ENCOUNTER — Telehealth (INDEPENDENT_AMBULATORY_CARE_PROVIDER_SITE_OTHER): Payer: BC Managed Care – PPO | Admitting: Psychiatry

## 2022-12-26 ENCOUNTER — Encounter: Payer: Self-pay | Admitting: Psychiatry

## 2022-12-26 DIAGNOSIS — F17220 Nicotine dependence, chewing tobacco, uncomplicated: Secondary | ICD-10-CM

## 2022-12-26 DIAGNOSIS — F17200 Nicotine dependence, unspecified, uncomplicated: Secondary | ICD-10-CM

## 2022-12-26 DIAGNOSIS — F1590 Other stimulant use, unspecified, uncomplicated: Secondary | ICD-10-CM

## 2022-12-26 DIAGNOSIS — F411 Generalized anxiety disorder: Secondary | ICD-10-CM

## 2022-12-26 DIAGNOSIS — R41840 Attention and concentration deficit: Secondary | ICD-10-CM | POA: Diagnosis not present

## 2022-12-26 MED ORDER — ESCITALOPRAM OXALATE 10 MG PO TABS: 15.0000 mg | ORAL_TABLET | Freq: Every day | ORAL | 1 refills | Status: DC

## 2022-12-26 MED ORDER — PROPRANOLOL HCL 10 MG PO TABS: 10.0000 mg | ORAL_TABLET | Freq: Two times a day (BID) | ORAL | 1 refills | Status: DC | PRN

## 2022-12-26 NOTE — Progress Notes (Signed)
Virtual Visit via Video Note  I connected with Jeffery Gomez on 12/26/22 at  1:00 PM EST by a video enabled telemedicine application and verified that I am speaking with the correct person using two identifiers.  Location Provider Location : [facility] Patient Location : Work  Participants: Patient , Provider    I discussed the limitations of evaluation and management by telemedicine and the availability of in person appointments. The patient expressed understanding and agreed to proceed.   I discussed the assessment and treatment plan with the patient. The patient was provided an opportunity to ask questions and all were answered. The patient agreed with the plan and demonstrated an understanding of the instructions.   The patient was advised to call back or seek an in-person evaluation if the symptoms worsen or if the condition fails to improve as anticipated.   [place] MD OP Progress Note  12/26/2022 1:53 PM BB ROZEBOOM  MRN:  [id number]  Chief Complaint:  Chief Complaint  Patient presents with   Follow-up   Anxiety   Medication Refill   HPI: Jeffery Gomez is a 37 year old Caucasian male, married, employed, lives in [place], has a history of GAD, attention and focus deficit, caffeine use disorder, tobacco use disorder, excessive sleepiness was evaluated by telemedicine today.  Patient today reports he continues to struggle with anxiety, wakes up most mornings feeling nervous, 'internal anxiety '.  Patient reports he likes the effect of Lexapro which likely could be helping to some extent with his anxiety symptoms.  However he feels the dosage likely could be a bit strong for him since he feels numb.  Patient continues to struggle with feeling fatigued and tired during the day.  Recently diagnosed with mild sleep apnea.  Continues to be uncorrected.  Reports he was referred to ENT for an evaluation however he has not done that appointment yet.  He reports he is planning to  schedule it soon.  Patient continues to struggle with attention and focus problems.  Reports he has neuropsychological testing coming up.  Denies any suicidality, homicidality or perceptual disturbances.  Reports currently working on cutting back on both caffeine use and chewing tobacco.  Patient denies any other concerns today.  Visit Diagnosis:    ICD-10-CM   1. Generalized anxiety disorder  F41.1 escitalopram (LEXAPRO) 10 MG tablet    propranolol (INDERAL) 10 MG tablet    2. Attention and concentration deficit  R41.840     3. 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 Tobacco use disorder  F17.200       Past Psychiatric History: Reviewed past psychiatric history from progress note on 04/24/2022.  Past also medications like BuSpar-side effect, Depakote-side effect, Lexapro, Zyprexa.  Past Medical History:  Past Medical History:  Diagnosis Date   Anxiety    Bipolar disorder ([place])    Panic attacks     Past Surgical History:  Procedure Laterality Date   NO PAST SURGERIES      Family Psychiatric History: Reviewed family psychiatric history from progress note on 04/24/2022.  Family History:  Family History  Problem Relation Age of Onset   Anxiety disorder Mother    Panic disorder Mother    Bipolar disorder Father    Schizophrenia Father    Alcohol abuse Brother    Bipolar disorder Brother    ADD / ADHD Brother    Anxiety disorder Brother    Bipolar disorder Maternal Aunt    Alcohol abuse Maternal Grandfather  Cirrhosis Maternal Grandfather    Heart attack Maternal Grandmother    Bipolar disorder Paternal Grandmother    Thyroid disease Paternal Grandmother     Social History: Reviewed social history from progress note on 04/24/2022. Social History   Socioeconomic History   Marital status: Married    Spouse name: Chelsie   Number of children: 3   Years of education: 110 TH GRADE   Highest education level: Not on file  Occupational History   Not on file  Tobacco  Use   Smoking status: Some Days    Types: Cigarettes   Smokeless tobacco: Current    Types: Chew   Tobacco comments:    1 pack per week--03/20/2022  Vaping Use   Vaping Use: Never used  Substance and Sexual Activity   Alcohol use: Yes    Comment: occasinal   Drug use: No   Sexual activity: Yes    Partners: Female  Other Topics Concern   Not on file  Social History Narrative   Not on file   Social Determinants of Health   Financial Resource Strain: Low Risk  (04/22/2019)   Overall Financial Resource Strain (CARDIA)    Difficulty of Paying Living Expenses: Not hard at all  Food Insecurity: No Food Insecurity (04/22/2019)   Hunger Vital Sign    Worried About Running Out of Food in the Last Year: Never true    Ran Out of Food in the Last Year: Never true  Transportation Needs: No Transportation Needs (04/22/2019)   PRAPARE - Hydrologist (Medical): No    Lack of Transportation (Non-Medical): No  Physical Activity: Inactive (04/22/2019)   Exercise Vital Sign    Days of Exercise per Week: 0 days    Minutes of Exercise per Session: 0 min  Stress: No Stress Concern Present (04/22/2019)   [place]    Feeling of Stress : Only a little  Social Connections: Somewhat Isolated (04/22/2019)   Social Connection and Isolation Panel [NHANES]    Frequency of Communication with Friends and Family: More than three times a week    Frequency of Social Gatherings with Friends and Family: More than three times a week    Attends Religious Services: Never    Marine scientist or Organizations: No    Attends Archivist Meetings: Never    Marital Status: Married    Allergies:  Allergies  Allergen Reactions   Depakote [Divalproex Sodium] Anaphylaxis    Metabolic Disorder Labs: No results found for: "HGBA1C", "MPG" No results found for: "PROLACTIN" No results found for: "CHOL", "TRIG", "HDL",  "CHOLHDL", "VLDL", "LDLCALC" Lab Results  Component Value Date   TSH 1.12 03/19/2022   TSH 1.03 12/30/2017    Therapeutic Level Labs: No results found for: "LITHIUM" No results found for: "VALPROATE" No results found for: "CBMZ"  Current Medications: Current Outpatient Medications  Medication Sig Dispense Refill   escitalopram (LEXAPRO) 10 MG tablet Take 1.5 tablets (15 mg total) by mouth daily with breakfast. 45 tablet 1   propranolol (INDERAL) 10 MG tablet Take 1 tablet (10 mg total) by mouth 2 (two) times daily as needed. anxiety 60 tablet 1   fluticasone (FLONASE) 50 MCG/ACT nasal spray Place 2 sprays into both nostrils daily. 16 g 6   No current facility-administered medications for this visit.     Musculoskeletal: Strength & Muscle Tone: within normal limits Gait & Station: normal Patient leans: N/A  Psychiatric Specialty Exam: Review of Systems  Constitutional:  Positive for fatigue.  Psychiatric/Behavioral:  Positive for decreased concentration and sleep disturbance. The patient is nervous/anxious.   All other systems reviewed and are negative.   There were no vitals taken for this visit.There is no height or weight on file to calculate BMI.  General Appearance: Casual  Eye Contact:  Fair  Speech:  Clear and Coherent  Volume:  Normal  Mood:  Anxious  Affect:  Congruent  Thought Process:  Goal Directed and Descriptions of Associations: Intact  Orientation:  Full (Time, Place, and Person)  Thought Content: Logical   Suicidal Thoughts:  No  Homicidal Thoughts:  No  Memory:  Immediate;   Fair Recent;   Fair Remote;   Fair  Judgement:  Fair  Insight:  Fair  Psychomotor Activity:  Normal  Concentration:  Concentration: Fair and Attention Span: Fair  Recall:  [employer] of Knowledge: Fair  Language: Fair  Akathisia:  No  Handed:  Right  AIMS (if indicated): not done  Assets:  Communication Skills Desire for Improvement Housing Social Support  ADL's:   Intact  Cognition: WNL  Sleep:   restless. Has sleep apnea uncorrected   Screenings: GAD-7    Flowsheet Row Video Visit from 12/26/2022 in [facility] Video Visit from 11/13/2022 in [facility] Office Visit from 09/19/2022 in [facility] Office Visit from 07/23/2022 in [facility] Office Visit from 05/23/2022 in [facility]  Total GAD-7 Score 14 18 16 11 10      $ PHQ2-9    Flowsheet Row Video Visit from 11/13/2022 in [facility] Office Visit from 09/19/2022 in [facility] Office Visit from 07/23/2022 in [facility] Office Visit from 05/23/2022 in [facility] Office Visit from 04/24/2022 in [facility]  PHQ-2 Total Score 1 2 2 $ 0 1  PHQ-9 Total Score -- 10 9 4 11      $ Flowsheet Row Video Visit from 12/26/2022 in [facility] Video Visit from 11/13/2022 in [facility] Office Visit from 07/23/2022 in [facility] No Risk No Risk No Risk        Assessment and Plan: Jeffery Gomez is a 37 year old Caucasian male, married, employed, lives in [place], has a history of generalized anxiety disorder, attention and focus problem, excessive daytime sleepiness, was evaluated by telemedicine today.  Patient is currently struggling with anxiety, sleep problems, noncompliant with CPAP for obstructive sleep apnea, will benefit from the following plan.  Plan GAD-some improvement Reduce Lexapro to 15 mg p.o. daily for side effects Start propranolol 10 mg p.o. twice daily as needed for  anxiety Provided medication education, advised to limit use of propranolol.  Attention and focus deficit-unstable Patient has upcoming ADHD testing with Dr. Lurline Hare. Encouraged to keep the appointment  Caffeine use disorder likely mild-improving Patient encouraged to cut back.  Anxiety symptoms especially in the morning likely also due to caffeine withdrawal.  Tobacco use disorder-improving Patient currently trying to cut back.  Patient encouraged to follow up with his provider as well as [facility] for mouthpiece as well as ENT evaluation, recent diagnosis of mild sleep apnea will manage correction.  Follow-up in clinic in 6 weeks  or sooner if needed.    This note was generated in part or whole with voice recognition software. Voice recognition is usually quite accurate but there are transcription errors that can and very often do occur. I apologize for any typographical errors that were not detected and corrected.     Ursula Alert, MD 12/27/2022, 8:09 AM

## 2023-01-17 ENCOUNTER — Other Ambulatory Visit: Payer: Self-pay | Admitting: Psychiatry

## 2023-01-17 DIAGNOSIS — F411 Generalized anxiety disorder: Secondary | ICD-10-CM

## 2023-02-06 ENCOUNTER — Telehealth (INDEPENDENT_AMBULATORY_CARE_PROVIDER_SITE_OTHER): Payer: BC Managed Care – PPO | Admitting: Psychiatry

## 2023-02-06 DIAGNOSIS — F411 Generalized anxiety disorder: Secondary | ICD-10-CM

## 2023-02-06 NOTE — Progress Notes (Signed)
Patient picked up a call and stated he is unable to do the appointment today since he had to pick up his son from school and the change in the schedule.  Patient reported that he left 2 messages this morning.  Discussed no-show policy with patient.  Also discussed with patient that staff will contact him to reschedule a follow-up appointment.

## 2023-02-12 ENCOUNTER — Ambulatory Visit (INDEPENDENT_AMBULATORY_CARE_PROVIDER_SITE_OTHER): Payer: BC Managed Care – PPO | Admitting: Psychology

## 2023-02-12 DIAGNOSIS — J329 Chronic sinusitis, unspecified: Secondary | ICD-10-CM | POA: Diagnosis not present

## 2023-02-12 DIAGNOSIS — J301 Allergic rhinitis due to pollen: Secondary | ICD-10-CM | POA: Diagnosis not present

## 2023-02-12 DIAGNOSIS — F89 Unspecified disorder of psychological development: Secondary | ICD-10-CM

## 2023-02-12 DIAGNOSIS — J342 Deviated nasal septum: Secondary | ICD-10-CM | POA: Diagnosis not present
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
## 2023-02-20 ENCOUNTER — Ambulatory Visit: Payer: BC Managed Care – PPO | Admitting: Psychology

## 2023-02-20 DIAGNOSIS — F89 Unspecified disorder of psychological development: Secondary | ICD-10-CM

## 2023-02-20 NOTE — Progress Notes (Signed)
Date: 02/20/2023   Appointment Start Time: 9am Duration: 60 minutes Provider: Clarice Pole, PsyD Type of Session: Testing Appointment for Evaluation  Location of Patient: Home Location of Provider: Provider's Home (private office) Type of Contact: WebEx video visit with audio  Session Content: Today's appointment was a telepsychological visit due to COVID-19. Marck is aware it is his responsibility to secure confidentiality on his end of the session. Prior to proceeding with today's appointment, Brigido's physical location at the time of this appointment was obtained as well a phone number he could be reached at in the event of technical difficulties. Petter denied anyone else being present in the room or on the virtual appointment. This provider reviewed that video should not be captured, photos should not be taken, nor should testing stimuli be copied or recorded as it would be a copyright violation. Chael expressed understanding of the aforementioned, and verbally consented to proceed. The WAIS-IV was administered, scored, and interpreted by this evaluator  Billing codes will be input on the feedback appointment. There are no billing codes for the testing appointment.   Provisional DSM-5 diagnosis(es):  F89 Unspecified Disorder of Psychological Development   Plan: Sergey was scheduled for a feedback appointment on 03/05/2023 at 8am via WebEx video visit with audio.                Dolores Lory, PsyD

## 2023-02-21 ENCOUNTER — Other Ambulatory Visit: Payer: Self-pay | Admitting: Psychiatry

## 2023-02-21 DIAGNOSIS — F411 Generalized anxiety disorder: Secondary | ICD-10-CM

## 2023-03-03 ENCOUNTER — Telehealth (INDEPENDENT_AMBULATORY_CARE_PROVIDER_SITE_OTHER): Payer: BC Managed Care – PPO | Admitting: Psychiatry

## 2023-03-03 ENCOUNTER — Encounter: Payer: Self-pay | Admitting: Psychiatry

## 2023-03-03 DIAGNOSIS — F17210 Nicotine dependence, cigarettes, uncomplicated: Secondary | ICD-10-CM | POA: Diagnosis not present

## 2023-03-03 DIAGNOSIS — R41840 Attention and concentration deficit: Secondary | ICD-10-CM | POA: Diagnosis not present

## 2023-03-03 DIAGNOSIS — F17220 Nicotine dependence, chewing tobacco, uncomplicated: Secondary | ICD-10-CM

## 2023-03-03 DIAGNOSIS — F411 Generalized anxiety disorder: Secondary | ICD-10-CM

## 2023-03-03 DIAGNOSIS — F1590 Other stimulant use, unspecified, uncomplicated: Secondary | ICD-10-CM | POA: Diagnosis not present

## 2023-03-03 DIAGNOSIS — F17200 Nicotine dependence, unspecified, uncomplicated: Secondary | ICD-10-CM

## 2023-03-03 MED ORDER — PROPRANOLOL HCL 20 MG PO TABS: 20.0000 mg | ORAL_TABLET | Freq: Two times a day (BID) | ORAL | 1 refills | Status: DC | PRN

## 2023-03-03 NOTE — Progress Notes (Unsigned)
Virtual Visit via Video Note  I connected with Jeffery Gomez on 03/03/23 at  4:30 PM EDT by a video enabled telemedicine application and verified that I am speaking with the correct person using two identifiers.  Location Provider Location : [facility] Patient Location : Home  Participants: Patient , Provider   I discussed the limitations of evaluation and management by telemedicine and the availability of in person appointments. The patient expressed understanding and agreed to proceed.   I discussed the assessment and treatment plan with the patient. The patient was provided an opportunity to ask questions and all were answered. The patient agreed with the plan and demonstrated an understanding of the instructions.   The patient was advised to call back or seek an in-person evaluation if the symptoms worsen or if the condition fails to improve as anticipated.   BH MD OP Progress Note  03/04/2023 1:13 PM Jeffery Gomez  MRN:  [id number]  Chief Complaint:  Chief Complaint  Patient presents with   Follow-up   Anxiety   Medication Refill   HPI: Jeffery Gomez is a 37 year old Caucasian male, married, employed, lives in [place], has a history of GAD, attention and focus deficit, caffeine use disorder, tobacco use disorder, excessive sleepiness was evaluated by telemedicine today.  Patient today reports he is currently not compliant with a higher dosage of Lexapro.  Has been taking only at 10 mg although medication dosage was increased .  Patient reports he continues to have internal anxiety, nervousness on a regular basis.  He continues to struggle with his attention and focus.  Patient also reports feeling tired and fatigued during the day.  Patient however reports he had a sleep study completed, was diagnosed with mild sleep apnea.  Recommended to use a mouthpiece however they are so expensive and hence he has not bought it yet.  Patient reports he is currently undergoing  testing, psychological with Dr. Dewaine Conger to rule out ADHD, has 1 more appointment coming up on Wednesday.  Patient reports he also had the appointment with an ENT specialist and was prescribed prednisone for sinusitis.  He also completed a course of antibiotic.  Patient reports while he was on prednisone he felt calm, however once he completed the prednisone he felt the anxiety was back.  Patient denies any suicidality, homicidality or perceptual disturbances.  Patient agreeable to dosage increase of Lexapro since he has been noncompliant.  Denies any other concerns today.  Visit Diagnosis:    ICD-10-CM   1. Generalized anxiety disorder  F41.1 propranolol (INDERAL) 20 MG tablet    2. Attention and concentration deficit  R41.840     3. 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 Tobacco use disorder  F17.200       Past Psychiatric History: Reviewed past psychiatric history from progress note on 04/24/2022.  Past trials of medications like BuSpar-side effect, Depakote-side effect, Lexapro, Zyprexa.  Past Medical History:  Past Medical History:  Diagnosis Date   Anxiety    Bipolar disorder    Panic attacks     Past Surgical History:  Procedure Laterality Date   NO PAST SURGERIES      Family Psychiatric History: Reviewed family psychiatric history from progress note on 04/24/2022.  Family History:  Family History  Problem Relation Age of Onset   Anxiety disorder Mother    Panic disorder Mother    Bipolar disorder Father    Schizophrenia Father    Alcohol abuse Brother  Bipolar disorder Brother    ADD / ADHD Brother    Anxiety disorder Brother    Bipolar disorder Maternal Aunt    Alcohol abuse Maternal Grandfather    Cirrhosis Maternal Grandfather    Heart attack Maternal Grandmother    Bipolar disorder Paternal Grandmother    Thyroid disease Paternal Grandmother     Social History: Reviewed social history from progress note on 04/24/2022. Social History   Socioeconomic  History   Marital status: Married    Spouse name: Chelsie   Number of children: 3   Years of education: 11 TH GRADE   Highest education level: Not on file  Occupational History   Not on file  Tobacco Use   Smoking status: Some Days    Types: Cigarettes   Smokeless tobacco: Current    Types: Chew   Tobacco comments:    1 pack per week--03/20/2022  Vaping Use   Vaping Use: Never used  Substance and Sexual Activity   Alcohol use: Yes    Comment: occasinal   Drug use: No   Sexual activity: Yes    Partners: Female  Other Topics Concern   Not on file  Social History Narrative   Not on file   Social Determinants of Health   Financial Resource Strain: Low Risk  (04/22/2019)   Overall Financial Resource Strain (CARDIA)    Difficulty of Paying Living Expenses: Not hard at all  Food Insecurity: No Food Insecurity (04/22/2019)   Hunger Vital Sign    Worried About Running Out of Food in the Last Year: Never true    Ran Out of Food in the Last Year: Never true  Transportation Needs: No Transportation Needs (04/22/2019)   PRAPARE - Administrator, Civil Service (Medical): No    Lack of Transportation (Non-Medical): No  Physical Activity: Inactive (04/22/2019)   Exercise Vital Sign    Days of Exercise per Week: 0 days    Minutes of Exercise per Session: 0 min  Stress: No Stress Concern Present (04/22/2019)   [employer] of Occupational Health - Occupational Stress Questionnaire    Feeling of Stress : Only a little  Social Connections: Somewhat Isolated (04/22/2019)   Social Connection and Isolation Panel [NHANES]    Frequency of Communication with Friends and Family: More than three times a week    Frequency of Social Gatherings with Friends and Family: More than three times a week    Attends Religious Services: Never    Database administrator or Organizations: No    Attends Banker Meetings: Never    Marital Status: Married    Allergies:  Allergies   Allergen Reactions   Depakote [Divalproex Sodium] Anaphylaxis    Metabolic Disorder Labs: No results found for: "HGBA1C", "MPG" No results found for: "PROLACTIN" No results found for: "CHOL", "TRIG", "HDL", "CHOLHDL", "VLDL", "LDLCALC" Lab Results  Component Value Date   TSH 1.12 03/19/2022   TSH 1.03 12/30/2017    Therapeutic Level Labs: No results found for: "LITHIUM" No results found for: "VALPROATE" No results found for: "CBMZ"  Current Medications: Current Outpatient Medications  Medication Sig Dispense Refill   Azelastine HCl 137 MCG/SPRAY SOLN SMARTSIG:1-2 Spray(s) Both Nares Every 12 Hours PRN     cefdinir (OMNICEF) 300 MG capsule Take 300 mg by mouth 2 (two) times daily.     clobetasol (TEMOVATE) 0.05 % external solution SMARTSIG:Sparingly Topical Twice Daily     propranolol (INDERAL) 20 MG tablet Take 1 tablet (  20 mg total) by mouth 2 (two) times daily as needed. For anxiety 60 tablet 1   escitalopram (LEXAPRO) 10 MG tablet TAKE 1.5 TABLETS (15 MG TOTAL) BY MOUTH DAILY WITH BREAKFAST. 45 tablet 1   fluticasone (FLONASE) 50 MCG/ACT nasal spray Place 2 sprays into both nostrils daily. 16 g 6   No current facility-administered medications for this visit.     Musculoskeletal: Strength & Muscle Tone:  UTA Gait & Station:  Seated Patient leans: N/A  Psychiatric Specialty Exam: Review of Systems  Constitutional:  Positive for fatigue.  Psychiatric/Behavioral:  Positive for decreased concentration and sleep disturbance. The patient is nervous/anxious.   All other systems reviewed and are negative.   There were no vitals taken for this visit.There is no height or weight on file to calculate BMI.  General Appearance: Casual  Eye Contact:  Fair  Speech:  Clear and Coherent  Volume:  Normal  Mood:  Anxious  Affect:  Congruent  Thought Process:  Goal Directed and Descriptions of Associations: Intact  Orientation:  Full (Time, Place, and Person)  Thought Content:  Logical   Suicidal Thoughts:  No  Homicidal Thoughts:  No  Memory:  Immediate;   Fair Recent;   Fair Remote;   Fair  Judgement:  Fair  Insight:  Fair  Psychomotor Activity:  Normal  Concentration:  Concentration: Fair and Attention Span: Fair  Recall:  [employer] of Knowledge: Fair  Language: Fair  Akathisia:  No  Handed:  Right  AIMS (if indicated): not done  Assets:  Communication Skills Desire for Improvement Housing Social Support  ADL's:  Intact  Cognition: WNL  Sleep:   Restless, recently diagnosed with mild sleep apnea, noncompliant with mouthpiece.   Screenings: GAD-7    Flowsheet Row Video Visit from 12/26/2022 in [facility] Video Visit from 11/13/2022 in [facility] Office Visit from 09/19/2022 in [facility] Office Visit from 07/23/2022 in [facility] Office Visit from 05/23/2022 in [facility]  Total GAD-7 Score 14 18 16 11 10       PHQ2-9    Flowsheet Row Video Visit from 11/13/2022 in [facility] Office Visit from 09/19/2022 in [facility] Office Visit from 07/23/2022 in [facility] Office Visit from 05/23/2022 in [facility] Office Visit from 04/24/2022 in [facility]  PHQ-2 Total Score 1 2 2  0 1  PHQ-9 Total Score -- 10 9 4 11       Flowsheet Row Video Visit from 03/03/2023 in [facility] Video Visit from 12/26/2022 in [facility] Video Visit from 11/13/2022 in [facility]  C-SSRS RISK CATEGORY No Risk No Risk No Risk        Assessment and Plan: Jeffery Gomez is a  37 year old Caucasian male, married, employed, lives in [place], has a history of generalized anxiety disorder, attention and focus problem, excessive daytime sleepiness was evaluated by telemedicine today.  Patient currently noncompliant with recommendations for medication management as well as treatment for obstructive sleep apnea, recently diagnosed.  Discussed the following plan with patient since patient continues to have anxiety, concentration problems and tiredness.  Plan as noted below.  Plan GAD-some improvement Patient was started on Lexapro 15 mg p.o. daily previously however  he has been noncompliant and takes only a 10 mg.  Encouraged compliance, advised patient to increase to Lexapro 15 mg p.o. daily Increase propranolol to 20 mg p.o. twice daily as needed for anxiety   Attention and focus deficit-unstable Patient currently undergoing testing-was referred to Dr.Altabet   Caffeine use disorder likely mild-improving Will monitor closely  Tobacco use disorder-improving Patient currently cutting back.  Patient encouraged to start using mouthpiece for his obstructive sleep apnea which likely could be causing fatigue, concentration problems during the day.  Follow-up in clinic in 4 to 6 weeks or sooner if needed.     Consent: Patient/Guardian gives verbal consent for treatment and assignment of benefits for services provided during this visit. Patient/Guardian expressed understanding and agreed to proceed.   This note was generated in part or whole with voice recognition software. Voice recognition is usually quite accurate but there are transcription errors that can and very often do occur. I apologize for any typographical errors that were not detected and corrected.    Jomarie Longs, MD 03/04/2023, 1:13 PM

## 2023-03-05 ENCOUNTER — Ambulatory Visit (INDEPENDENT_AMBULATORY_CARE_PROVIDER_SITE_OTHER): Payer: BC Managed Care – PPO | Admitting: Psychology

## 2023-03-05 DIAGNOSIS — F902 Attention-deficit hyperactivity disorder, combined type: Secondary | ICD-10-CM | POA: Diagnosis not present

## 2023-03-05 DIAGNOSIS — J342 Deviated nasal septum: Secondary | ICD-10-CM | POA: Diagnosis not present

## 2023-03-05 DIAGNOSIS — J301 Allergic rhinitis due to pollen: Secondary | ICD-10-CM | POA: Diagnosis not present

## 2023-03-05 DIAGNOSIS — J329 Chronic sinusitis, unspecified: Secondary | ICD-10-CM | POA: Diagnosis not present
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
## 2023-03-07 ENCOUNTER — Other Ambulatory Visit: Payer: Self-pay | Admitting: Psychiatry

## 2023-03-07 DIAGNOSIS — F411 Generalized anxiety disorder: Secondary | ICD-10-CM

## 2023-03-10 ENCOUNTER — Telehealth: Payer: Self-pay | Admitting: Psychiatry

## 2023-03-10 NOTE — Telephone Encounter (Signed)
I have received ADHD testing report for this patient will have staff contact patient to have a sooner in person visit

## 2023-03-20 ENCOUNTER — Other Ambulatory Visit: Payer: Self-pay

## 2023-03-20 ENCOUNTER — Ambulatory Visit (INDEPENDENT_AMBULATORY_CARE_PROVIDER_SITE_OTHER): Payer: BC Managed Care – PPO | Admitting: Psychiatry

## 2023-03-20 ENCOUNTER — Encounter: Payer: BC Managed Care – PPO | Admitting: Nurse Practitioner

## 2023-03-20 ENCOUNTER — Other Ambulatory Visit
Admission: RE | Admit: 2023-03-20 | Discharge: 2023-03-20 | Disposition: A | Discharge status: Home / Self Care | Payer: BC Managed Care – PPO | Source: Home / Self Care | Attending: Psychiatry | Admitting: Psychiatry

## 2023-03-20 ENCOUNTER — Encounter: Payer: Self-pay | Admitting: Psychiatry

## 2023-03-20 ENCOUNTER — Ambulatory Visit
Admission: RE | Admit: 2023-03-20 | Discharge: 2023-03-20 | Disposition: A | Discharge status: Home / Self Care | Payer: BC Managed Care – PPO | Source: Ambulatory Visit | Attending: Psychiatry | Admitting: Psychiatry

## 2023-03-20 VITALS — BP 124/72 | HR 82 | Temp 98.6°F | Ht 69.0 in | Wt 173.6 lb

## 2023-03-20 DIAGNOSIS — F1590 Other stimulant use, unspecified, uncomplicated: Secondary | ICD-10-CM

## 2023-03-20 DIAGNOSIS — F17200 Nicotine dependence, unspecified, uncomplicated: Secondary | ICD-10-CM | POA: Diagnosis not present

## 2023-03-20 DIAGNOSIS — Z79899 Other long term (current) drug therapy: Secondary | ICD-10-CM

## 2023-03-20 DIAGNOSIS — F902 Attention-deficit hyperactivity disorder, combined type: Secondary | ICD-10-CM | POA: Diagnosis not present

## 2023-03-20 DIAGNOSIS — F411 Generalized anxiety disorder: Secondary | ICD-10-CM | POA: Diagnosis not present

## 2023-03-20 NOTE — Progress Notes (Signed)
BH MD OP Progress Note  03/20/2023 11:42 AM Jeffery Gomez  MRN:  [id number]  Chief Complaint:  Chief Complaint  Patient presents with   Follow-up   Anxiety   ADD   Medication Refill   HPI: Jeffery Gomez is a 37 year old Caucasian male, married, employed, lives in [place], has a history of GAD, attention and focus deficit, caffeine use disorder, tobacco use disorder, recent diagnosis of obstructive sleep apnea currently not on CPAP, was evaluated in office today.  Patient today reports continues to have anxiety on and off.  Patient reports recently he had an episode when he had heart palpitations at night.  Patient reports he was able to take propranolol which kind of helped.  Patient reports he is compliant on the Lexapro.  That does help with his mood symptoms.  Denies side effects.  Patient had psychological testing by Dr. [name] to rule out ADHD-reviewed and discussed evaluation report dated 02/12/2023, 02/17/2023, 02/19/2023, 02/20/2023.  Patient meets criteria for ADHD, combined presentation, moderate.Continues to struggle with inattention, difficulty completing task, being restless and other symptoms. Patient today interested in trial of stimulant medications.  Patient open to trial of Vyvanse.  Patient is currently waiting for a mouthpiece for his sleep apnea diagnosis.  Patient reports he is going to wait for a couple of months so he can afford it.  Currently working on cutting back on the caffeine.  Reports he was treated with a course of prednisone which helped with his sinusitis and that also kind of helped with his sleep.  Patient denies any suicidality, homicidality or perceptual disturbances.  Patient denies any other concerns today.    Visit Diagnosis:    ICD-10-CM   1. Generalized anxiety disorder  F41.1     2. Attention deficit hyperactivity disorder (ADHD), combined type  F90.2 EKG 12-Lead    Urine drugs of abuse scrn w alc, routine (Ref Lab)    3.  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 Tobacco use disorder  F17.200     5. High risk medication use  Z79.899 EKG 12-Lead    Urine drugs of abuse scrn w alc, routine (Ref Lab)      Past Psychiatric History: I have reviewed past psychiatric history from progress note on 04/24/2022.  Past trials of medications like BuSpar-side effects, Depakote-side effect, Lexapro, Zyprexa  Past Medical History:  Past Medical History:  Diagnosis Date   Anxiety    Bipolar disorder (HCC)    Panic attacks     Past Surgical History:  Procedure Laterality Date   NO PAST SURGERIES      Family Psychiatric History: Reviewed family psychiatric history from progress note on 04/24/2022.  Family History:  Family History  Problem Relation Age of Onset   Anxiety disorder Mother    Panic disorder Mother    Bipolar disorder Father    Schizophrenia Father    Alcohol abuse Brother    Bipolar disorder Brother    ADD / ADHD Brother    Anxiety disorder Brother    Bipolar disorder Maternal Aunt    Alcohol abuse Maternal Grandfather    Cirrhosis Maternal Grandfather    Heart attack Maternal Grandmother    Bipolar disorder Paternal Grandmother    Thyroid disease Paternal Grandmother     Social History: Reviewed social history from progress note on 04/24/2022. Social History   Socioeconomic History   Marital status: Married  Spouse name: Chelsie   Number of children: 3   Years of education: 82 TH GRADE   Highest education level: Not on file  Occupational History   Not on file  Tobacco Use   Smoking status: Some Days    Types: Cigarettes   Smokeless tobacco: Current    Types: Chew   Tobacco comments:    1 pack per week--03/20/2022  Vaping Use   Vaping Use: Never used  Substance and Sexual Activity   Alcohol use: Yes    Comment: occasinal   Drug use: No   Sexual activity: Yes    Partners: Female  Other Topics Concern   Not on file  Social History  Narrative   Not on file   Social Determinants of Health   Financial Resource Strain: Low Risk  (04/22/2019)   Overall Financial Resource Strain (CARDIA)    Difficulty of Paying Living Expenses: Not hard at all  Food Insecurity: No Food Insecurity (04/22/2019)   Hunger Vital Sign    Worried About Running Out of Food in the Last Year: Never true    Ran Out of Food in the Last Year: Never true  Transportation Needs: No Transportation Needs (04/22/2019)   PRAPARE - Administrator, Civil Service (Medical): No    Lack of Transportation (Non-Medical): No  Physical Activity: Inactive (04/22/2019)   Exercise Vital Sign    Days of Exercise per Week: 0 days    Minutes of Exercise per Session: 0 min  Stress: No Stress Concern Present (04/22/2019)   [employer] of Occupational Health - Occupational Stress Questionnaire    Feeling of Stress : Only a little  Social Connections: Somewhat Isolated (04/22/2019)   Social Connection and Isolation Panel [NHANES]    Frequency of Communication with Friends and Family: More than three times a week    Frequency of Social Gatherings with Friends and Family: More than three times a week    Attends Religious Services: Never    Database administrator or Organizations: No    Attends Banker Meetings: Never    Marital Status: Married    Allergies:  Allergies  Allergen Reactions   Depakote [Divalproex Sodium] Anaphylaxis    Metabolic Disorder Labs: No results found for: "HGBA1C", "MPG" No results found for: "PROLACTIN" No results found for: "CHOL", "TRIG", "HDL", "CHOLHDL", "VLDL", "LDLCALC" Lab Results  Component Value Date   TSH 1.12 03/19/2022   TSH 1.03 12/30/2017    Therapeutic Level Labs: No results found for: "LITHIUM" No results found for: "VALPROATE" No results found for: "CBMZ"  Current Medications: Current Outpatient Medications  Medication Sig Dispense Refill   Azelastine HCl 137 MCG/SPRAY SOLN SMARTSIG:1-2  Spray(s) Both Nares Every 12 Hours PRN     cefdinir (OMNICEF) 300 MG capsule Take 300 mg by mouth 2 (two) times daily.     clobetasol (TEMOVATE) 0.05 % external solution SMARTSIG:Sparingly Topical Twice Daily     escitalopram (LEXAPRO) 10 MG tablet TAKE 1.5 TABLETS (15 MG TOTAL) BY MOUTH DAILY WITH BREAKFAST. 135 tablet 0   fluticasone (FLONASE) 50 MCG/ACT nasal spray Place 2 sprays into both nostrils daily. 16 g 6   propranolol (INDERAL) 20 MG tablet Take 1 tablet (20 mg total) by mouth 2 (two) times daily as needed. For anxiety 60 tablet 1   No current facility-administered medications for this visit.     Musculoskeletal: Strength & Muscle Tone: within normal limits Gait & Station: normal Patient leans: N/A  Psychiatric  Specialty Exam: Review of Systems  Psychiatric/Behavioral:  Positive for decreased concentration and sleep disturbance. The patient is nervous/anxious.   All other systems reviewed and are negative.   Blood pressure 124/72, pulse 82, temperature 98.6 F (37 C), temperature source Skin, height 5\' 9"  (1.753 m), weight 173 lb 9.6 oz (78.7 kg).Body mass index is 25.64 kg/m.  General Appearance: Casual  Eye Contact:  Fair  Speech:  Clear and Coherent  Volume:  Normal  Mood:  Anxious  Affect:  Appropriate  Thought Process:  Goal Directed and Descriptions of Associations: Intact  Orientation:  Full (Time, Place, and Person)  Thought Content: Logical   Suicidal Thoughts:  No  Homicidal Thoughts:  No  Memory:  Immediate;   Fair Recent;   Fair Remote;   Fair  Judgement:  Fair  Insight:  Fair  Psychomotor Activity:  Normal  Concentration:  Concentration: Fair and Attention Span: Fair  Recall:  [employer] of Knowledge: Fair  Language: Fair  Akathisia:  No  Handed:  Right  AIMS (if indicated): not done  Assets:  Communication Skills Desire for Improvement Housing Social Support  ADL's:  Intact  Cognition: WNL  Sleep:   Improving   Screenings: GAD-7     Flowsheet Row Video Visit from 12/26/2022 in [facility] Video Visit from 11/13/2022 in [facility] Office Visit from 09/19/2022 in [facility] Office Visit from 07/23/2022 in [facility] Office Visit from 05/23/2022 in [facility]  Total GAD-7 Score 14 18 16 11 10       PHQ2-9    Flowsheet Row Video Visit from 11/13/2022 in [facility] Office Visit from 09/19/2022 in [facility] Office Visit from 07/23/2022 in [facility] Office Visit from 05/23/2022 in [facility] Office Visit from 04/24/2022 in [facility]  PHQ-2 Total Score 1 2 2  0 1  PHQ-9 Total Score -- 10 9 4 11       Flowsheet Row Video Visit from 03/03/2023 in [facility] Video Visit from 12/26/2022 in [facility] Video Visit from 11/13/2022 in [facility]  C-SSRS RISK CATEGORY No Risk No Risk No Risk        Assessment and Plan: SEIYA BORKE is a 37 year old Caucasian male, married, employed, lives in [place], has a history of generalized anxiety disorder, ADHD, obstructive sleep apnea was evaluated in office today.  Patient is currently struggling with attention and focus problem, recent diagnosis of ADHD, uncorrected sleep apnea, will benefit from following plan.  Plan  GAD-improving Continue Lexapro 15 mg p.o. daily Propranolol 20 mg p.o. twice daily as needed  ADHD-combined type-unstable Will consider starting this patient on Vyvanse low dosage 10 mg p.o. daily.  Will gradually increase dosage if patient tolerates the  Vyvanse. Provided medication education about stimulants like Adderall, Adderall XR , Ritalin, Concerta, Focalin, Vyvanse including effect on heart, sudden cardiac arrest, drug to drug interaction with Lexapro, serotonin syndrome, effect on anxiety, sleep, habit-forming potential. Will get urine drug screen and EKG prior to starting this medication.  Caffeine use disorder likely mild-improving Provided counseling  Tobacco use disorder-improving Patient is currently cutting back  High risk medication use-will get EKG-patient to call [phone] , patient will need EKG prior to  starting stimulants due to cardiac effect.  Will also get urine drug screen-patient to go to [facility] lab.  I have reviewed and discussed ADHD testing report-per Dr. Quin Hoop noted above.  Follow-up in clinic in 4 weeks or sooner if needed.    Consent: Patient/Guardian gives verbal consent for treatment and assignment of benefits for services provided during this visit. Patient/Guardian expressed understanding and agreed to proceed.   I have spent atleast 40 minutes face to face with patient today which includes the time spent for preparing to see the patient ( e.g., review of test, records ), obtaining and to review and separately obtained history , ordering medications and test ,psychoeducation and supportive psychotherapy and care coordination,as well as documenting clinical information in electronic health record,interpreting and communication of test results.  This note was generated in part or whole with voice recognition software. Voice recognition is usually quite accurate but there are transcription errors that can and very often do occur. I apologize for any typographical errors that were not detected and corrected.     Jomarie Longs, MD 03/21/2023, 7:46 AM

## 2023-03-20 NOTE — Patient Instructions (Addendum)
Please call for EKG - [phone]   Lisdexamfetamine Capsule What is this medication? LISDEXAMFETAMINE (lis DEX am fet a meen) treats attention-deficit hyperactivity disorder (ADHD). It may also be used to treat binge eating disorder. It works by reducing hyperactivity and impulsive behaviors. It belongs to a group of medications called stimulants. This medicine may be used for other purposes; ask your health care provider or pharmacist if you have questions. COMMON BRAND NAME(S): Vyvanse What should I tell my care team before I take this medication? They need to know if you have any of these conditions: Anxiety or panic attacks Circulation problems in fingers and toes Glaucoma Hardening or blockages of the arteries or heart blood vessels Heart disease or a heart defect High blood pressure History of stroke Kidney disease Liver disease Mental health condition Seizures Substance use disorder Suicidal thoughts, plans, or attempt by you or a family member Thyroid disease Tourette's syndrome An unusual or allergic reaction to lisdexamfetamine, other medications, foods, dyes, or preservatives Pregnant or trying to get pregnant Breast-feeding How should I use this medication? Take this medication by mouth. Follow the directions on the prescription label. Swallow the capsules with a drink of water. You may open capsule and add to a glass of water, then drink right away. Take your doses at regular intervals. Do not take your medication more often than directed. Do not suddenly stop your medication. You must gradually reduce the dose, or you may feel withdrawal effects. Ask your care team for advice. A special MedGuide will be given to you by the pharmacist with each prescription and refill. Be sure to read this information carefully each time. Talk to your care team about the use of this medication in children. While this medication may be prescribed for children as young as 4 years of age  for selected conditions, precautions do apply. Overdosage: If you think you have taken too much of this medicine contact a poison control center or emergency room at once. NOTE: This medicine is only for you. Do not share this medicine with others. What if I miss a dose? If you miss a dose, take it as soon as you can. If it is almost time for your next dose, take only that dose. Do not take double or extra doses. What may interact with this medication? Do not take this medication with any of the following: MAOIs, such as Carbex, Eldepryl, Marplan, Nardil, and Parnate Other stimulant medications for attention disorders, weight loss, or to stay awake This medication may also interact with the following: Acetazolamide Ammonium chloride Antacids Ascorbic acid Atomoxetine Caffeine Certain medications for blood pressure Certain medications for mental health conditions Certain medications for seizures, such as carbamazepine, phenobarbital, phenytoin Certain medications for stomach problems, such as cimetidine, famotidine, omeprazole, lansoprazole Cold or allergy medications Green tea Levodopa Linezolid Medications for sleep during surgery Methenamine Norepinephrine Phenothiazines, such as chlorpromazine, mesoridazine, prochlorperazine, thioridazine Propoxyphene Sodium acid phosphate Sodium bicarbonate This list may not describe all possible interactions. Give your health care provider a list of all the medicines, herbs, non-prescription drugs, or dietary supplements you use. Also tell them if you smoke, drink alcohol, or use illegal drugs. Some items may interact with your medicine. What should I watch for while using this medication? Visit your care team for regular check-ups. This prescription requires that you follow special procedures with your care team and pharmacy. You will need to have a new written prescription from your care team every time you need a refill.  This medication has  a risk of abuse and dependence. Your care team will check you for this while you take this medication. Long term use of this medication may cause your brain and body to depend on it. This does not usually happen if you take breaks from this medication during weekends, holidays, or summer vacations. Talk to your care team about what works for you. If your care team wants you to stop this medication permanently, the dose may be slowly lowered over time to reduce the risk of side effects. This medication may affect your concentration, or hide signs of tiredness. Until you know how this medication affects you, do not drive, ride a bicycle, use machinery, or do anything that needs mental alertness. Tell your care team if this medication loses its effects, or if you feel you need to take more than the prescribed amount. Do not change your dose without talking to your care team. Decreased appetite is a common side effect when starting this medication. Eating small, frequent meals or snacks can help. Talk to your care team if you continue to have poor eating habits. Height and weight growth of a child taking this medication will be monitored closely. Do not take this medication close to bedtime. It may prevent you from sleeping. If you are going to need surgery, a MRI, CT scan, or other procedure, tell your care team that you are taking this medication. You may need to stop taking this medication before the procedure. Tell your care team right away if you notice unexplained wounds on your fingers and toes while taking this medication. You should also tell your care team if you experience numbness or pain, changes in the skin color, or sensitivity to temperature in your fingers or toes. What side effects may I notice from receiving this medication? Side effects that you should report to your care team as soon as possible: Allergic reactions--skin rash, itching, hives, swelling of the face, lips, tongue, or  throat Heart rhythm changes--fast or irregular heartbeat, dizziness, feeling faint or lightheaded, chest pain, trouble breathing Increase in blood pressure Mood and behavior changes--anxiety, nervousness, confusion, hallucinations, irritability, hostility, thoughts of suicide or self-harm, worsening mood, feelings of depression Painful or prolonged erection Raynaud's--cool, numb, or painful fingers or toes that may change color from pale, to blue, to red Stroke in adults--sudden numbness or weakness of the face, arm, or leg, trouble speaking, confusion, trouble walking, loss of balance or coordination, dizziness, severe headache, change in vision Side effects that usually do not require medical attention (report to your care team if they continue or are bothersome): Anxiety, nervousness Blurry vision Headache Loss of appetite Nausea Trouble sleeping Weight loss This list may not describe all possible side effects. Call your doctor for medical advice about side effects. You may report side effects to FDA at 1-800-FDA-1088. Where should I keep my medication? Keep out of the reach of children and pets. This medication can be abused. Keep it in a safe place to protect it from theft. Do not share it with anyone. It is only for you. Selling or giving away this medication is dangerous and against the law. Store at room temperature between 15 and 30 degrees C (59 and 86 degrees F). Protect from light. Keep container tightly closed. Get rid of any unused medication after the expiration date. This medication may cause harm and death if it is taken by other adults, children, or pets. It is important to get rid of the medication  as soon as you no longer need it or it is expired. You can do this in two ways: Take the medication to a medication take-back program. Check with your pharmacy or law enforcement to find a location. If you cannot return the medication, check the label or package insert to see if the  medication should be thrown out in the garbage or flushed down the toilet. If you are not sure, ask your care team. If it is safe to put it in the trash, take the medication out of the container. Mix the medication with cat litter, dirt, coffee grounds, or other unwanted substance. Seal the mixture in a bag or container. Put it in the trash. NOTE: This sheet is a summary. It may not cover all possible information. If you have questions about this medicine, talk to your doctor, pharmacist, or health care provider.  2023 Elsevier/Gold Standard (2020-12-21 00:00:00)

## 2023-03-21 LAB — URINE DRUGS OF ABUSE SCREEN W ALC, ROUTINE (REF LAB)
Amphetamines, Urine: NEGATIVE ng/mL
Barbiturate, Ur: NEGATIVE ng/mL
Benzodiazepine Quant, Ur: NEGATIVE ng/mL
Cannabinoid Quant, Ur: NEGATIVE ng/mL
Cocaine (Metab.): NEGATIVE ng/mL
Ethanol U, Quan: NEGATIVE %
Methadone Screen, Urine: NEGATIVE ng/mL
Opiate Quant, Ur: NEGATIVE ng/mL
Phencyclidine, Ur: NEGATIVE ng/mL
Propoxyphene, Urine: NEGATIVE ng/mL

## 2023-03-24 ENCOUNTER — Telehealth: Payer: Self-pay | Admitting: Psychiatry

## 2023-03-24 DIAGNOSIS — F902 Attention-deficit hyperactivity disorder, combined type: Secondary | ICD-10-CM

## 2023-03-24 MED ORDER — LISDEXAMFETAMINE DIMESYLATE 10 MG PO CAPS
10.0000 mg | ORAL_CAPSULE | Freq: Every morning | ORAL | 0 refills | Status: DC
Start: 2023-03-24 — End: 2023-04-22

## 2023-03-24 NOTE — Telephone Encounter (Signed)
Reviewed urine drug screen-03/20/2023-negative  EKG-03/20/2023-normal sinus rhythm.  Contacted patient to discuss.  Will go ahead and send Vyvanse 10 mg-low dosage daily in the morning to pharmacy at [facility].  Patient advised to call back in couple of weeks if he tolerates a 10 mg and at that time could increase to 20 mg if needed.

## 2023-03-31 ENCOUNTER — Ambulatory Visit (INDEPENDENT_AMBULATORY_CARE_PROVIDER_SITE_OTHER): Payer: BC Managed Care – PPO | Admitting: Nurse Practitioner

## 2023-03-31 ENCOUNTER — Other Ambulatory Visit: Payer: Self-pay

## 2023-03-31 ENCOUNTER — Encounter: Payer: Self-pay | Admitting: Nurse Practitioner

## 2023-03-31 VITALS — BP 112/76 | HR 87 | Temp 98.0°F | Resp 18 | Ht 69.0 in | Wt 173.8 lb

## 2023-03-31 DIAGNOSIS — Z131 Encounter for screening for diabetes mellitus: Secondary | ICD-10-CM

## 2023-03-31 DIAGNOSIS — Z13220 Encounter for screening for lipoid disorders: Secondary | ICD-10-CM

## 2023-03-31 DIAGNOSIS — Z130 Encounter for screening for diseases of the blood and blood-forming organs and certain disorders involving the immune mechanism: Secondary | ICD-10-CM

## 2023-03-31 DIAGNOSIS — Z114 Encounter for screening for human immunodeficiency virus [HIV]: Secondary | ICD-10-CM | POA: Diagnosis not present

## 2023-03-31 DIAGNOSIS — Z1159 Encounter for screening for other viral diseases: Secondary | ICD-10-CM | POA: Diagnosis not present

## 2023-03-31 DIAGNOSIS — Z0000 Encounter for general adult medical examination without abnormal findings: Secondary | ICD-10-CM | POA: Diagnosis not present

## 2023-03-31 LAB — CBC WITH DIFFERENTIAL/PLATELET
Eosinophils Absolute: 29 cells/uL (ref 15–500)
HCT: 41.5 % (ref 38.5–50.0)
Hemoglobin: 14.2 g/dL (ref 13.2–17.1)
Lymphs Abs: 1964 cells/uL (ref 850–3900)
MCHC: 34.2 g/dL (ref 32.0–36.0)
MCV: 84.2 fL (ref 80.0–100.0)
Neutro Abs: 4643 cells/uL (ref 1500–7800)
Neutrophils Relative %: 63.6 %
Platelets: 373 10*3/uL (ref 140–400)
RBC: 4.93 10*6/uL (ref 4.20–5.80)
RDW: 13 % (ref 11.0–15.0)
Total Lymphocyte: 26.9 %
WBC: 7.3 10*3/uL (ref 3.8–10.8)

## 2023-03-31 NOTE — Progress Notes (Signed)
Name: Jeffery Gomez   MRN: [id number]    DOB: April 22, 1986   Date:03/31/2023       Progress Note  Subjective  Chief Complaint  Chief Complaint  Patient presents with   Annual Exam    HPI  Patient presents for annual CPE .  Diet: well balanced diet Exercise: walks a lot , discussed recommendation of 150 min of physical activity a week Sleep: 7-8 hours Last dental exam:month ago Last eye exam: years ago  Depression: phq 9 is negative    03/31/2023   10:10 AM 11/13/2022    4:27 PM 09/19/2022    1:11 PM 07/23/2022    4:13 PM 07/23/2022    3:53 PM  Depression screen PHQ 2/9  Decreased Interest 0  1    Down, Depressed, Hopeless 0  1    PHQ - 2 Score 0  2    Altered sleeping 0  1    Tired, decreased energy 0  3    Change in appetite 0  0    Feeling bad or failure about yourself  0  1    Trouble concentrating 0  3    Moving slowly or fidgety/restless 0  0    Suicidal thoughts 0  0    PHQ-9 Score 0  10    Difficult doing work/chores Not difficult at all  Somewhat difficult       Information is confidential and restricted. Go to Review Flowsheets to unlock data.    Hypertension:  BP Readings from Last 3 Encounters:  03/31/23 112/76  09/19/22 124/72  03/20/22 122/70    Obesity: Wt Readings from Last 3 Encounters:  03/31/23 173 lb 12.8 oz (78.8 kg)  09/19/22 177 lb 11.2 oz (80.6 kg)  03/20/22 174 lb 6.4 oz (79.1 kg)   BMI Readings from Last 3 Encounters:  03/31/23 25.67 kg/m  09/19/22 26.24 kg/m  03/20/22 25.75 kg/m     Lipids:  No results found for: "CHOL" No results found for: "HDL" No results found for: "LDLCALC" No results found for: "TRIG" No results found for: "CHOLHDL" No results found for: "LDLDIRECT" Glucose:  Glucose  Date Value Ref Range Status  01/10/2012 102 (H) 65 - 99 mg/dL Final  29/52/8413 95 65 - 99 mg/dL Final   Glucose, Bld  Date Value Ref Range Status  03/19/2022 85 65 - 99 mg/dL Final    Comment:    .            Fasting  reference interval .   12/30/2017 100 65 - 139 mg/dL Final    Comment:    .        Non-fasting reference interval .     Flowsheet Row Office Visit from 03/31/2023 in [facility]  AUDIT-C Score 0        Married STD testing and prevention (HIV/chl/gon/syphilis): due Hep C: due   Skin cancer: Discussed monitoring for atypical lesions Colorectal cancer: no concerns, does not qualify Prostate cancer: no concerns, does not qualify No results found for: "PSA"   Lung cancer:   Low Dose CT Chest recommended if Age 13-80 years, 30 pack-year currently smoking OR have quit w/in 15years. Patient does not qualify.   AAA:  The USPSTF recommends one-time screening with ultrasonography in men ages 37 to 59 years who have ever smoked ECG:  03/20/2023  Vaccines:  HPV: up to at age 8 , ask insurance if age between 58-45  Shingrix: 73-64 yo and  ask insurance if covered when patient above 19 yo Pneumonia:  educated and discussed with patient. Flu:  educated and discussed with patient.  Advanced Care Planning: A voluntary discussion about advance care planning including the explanation and discussion of advance directives.  Discussed health care proxy and Living will, and the patient was able to identify a health care proxy as wife.  Patient does not have a living will at present time. If patient does have living will, I have requested they bring this to the clinic to be scanned in to their chart.  Patient Active Problem List   Diagnosis Date Noted   Attention deficit hyperactivity disorder (ADHD), combined type 03/20/2023   High risk medication use 03/20/2023   Noncompliance with treatment plan 11/13/2022   Sleep apnea 09/19/2022   Moderate episode of recurrent major depressive disorder (HCC) 09/19/2022   Attention and concentration deficit 04/24/2022   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   Chronic allergic rhinitis 03/20/2022   Family history of bipolar disorder  03/19/2022   Snores 03/19/2022   Tobacco use disorder 06/19/2020   Psoriasis 06/19/2020   Anxiety disorder 12/30/2017    Past Surgical History:  Procedure Laterality Date   NO PAST SURGERIES      Family History  Problem Relation Age of Onset   Anxiety disorder Mother    Panic disorder Mother    Bipolar disorder Father    Schizophrenia Father    Alcohol abuse Brother    Bipolar disorder Brother    ADD / ADHD Brother    Anxiety disorder Brother    Bipolar disorder Maternal Aunt    Alcohol abuse Maternal Grandfather    Cirrhosis Maternal Grandfather    Heart attack Maternal Grandmother    Bipolar disorder Paternal Grandmother    Thyroid disease Paternal Grandmother     Social History   Socioeconomic History   Marital status: Married    Spouse name: Chelsie   Number of children: 3   Years of education: 11 TH GRADE   Highest education level: Not on file  Occupational History   Not on file  Tobacco Use   Smoking status: Some Days    Types: Cigarettes   Smokeless tobacco: Current    Types: Chew   Tobacco comments:    1 pack per week--03/20/2022  Vaping Use   Vaping Use: Never used  Substance and Sexual Activity   Alcohol use: Yes    Comment: occasinal   Drug use: No   Sexual activity: Yes    Partners: Female  Other Topics Concern   Not on file  Social History Narrative   Works at [employer] Strain: Low Risk  (03/31/2023)   Overall Financial Resource Strain (CARDIA)    Difficulty of Paying Living Expenses: Not hard at all  Food Insecurity: No Food Insecurity (03/31/2023)   Hunger Vital Sign    Worried About Running Out of Food in the Last Year: Never true    Ran Out of Food in the Last Year: Never true  Transportation Needs: No Transportation Needs (03/31/2023)   PRAPARE - Administrator, Civil Service (Medical): No    Lack of Transportation (Non-Medical): No  Physical Activity: Insufficiently  Active (03/31/2023)   Exercise Vital Sign    Days of Exercise per Week: 3 days    Minutes of Exercise per Session: 30 min  Stress: No Stress Concern Present (03/31/2023)   [employer] of Occupational Health -  Occupational Stress Questionnaire    Feeling of Stress : Only a little  Social Connections: Moderately Integrated (03/31/2023)   Social Connection and Isolation Panel [NHANES]    Frequency of Communication with Friends and Family: More than three times a week    Frequency of Social Gatherings with Friends and Family: More than three times a week    Attends Religious Services: 1 to 4 times per year    Active Member of [employer] or Organizations: No    Attends Banker Meetings: Never    Marital Status: Married  Catering manager Violence: Not At Risk (03/31/2023)   Humiliation, Afraid, Rape, and Kick questionnaire    Fear of Current or Ex-Partner: No    Emotionally Abused: No    Physically Abused: No    Sexually Abused: No     Current Outpatient Medications:    Azelastine HCl 137 MCG/SPRAY SOLN, SMARTSIG:1-2 Spray(s) Both Nares Every 12 Hours PRN, Disp: , Rfl:    clobetasol (TEMOVATE) 0.05 % external solution, SMARTSIG:Sparingly Topical Twice Daily, Disp: , Rfl:    escitalopram (LEXAPRO) 10 MG tablet, TAKE 1.5 TABLETS (15 MG TOTAL) BY MOUTH DAILY WITH BREAKFAST., Disp: 135 tablet, Rfl: 0   fluticasone (FLONASE) 50 MCG/ACT nasal spray, Place 2 sprays into both nostrils daily., Disp: 16 g, Rfl: 6   lisdexamfetamine (VYVANSE) 10 MG capsule, Take 1 capsule (10 mg total) by mouth in the morning., Disp: 30 capsule, Rfl: 0   propranolol (INDERAL) 20 MG tablet, Take 1 tablet (20 mg total) by mouth 2 (two) times daily as needed. For anxiety, Disp: 60 tablet, Rfl: 1   cefdinir (OMNICEF) 300 MG capsule, Take 300 mg by mouth 2 (two) times daily. (Patient not taking: Reported on 03/31/2023), Disp: , Rfl:   Allergies  Allergen Reactions   Depakote [Divalproex Sodium] Anaphylaxis      ROS  Constitutional: Negative for fever or weight change.  Respiratory: Negative for cough and shortness of breath.   Cardiovascular: Negative for chest pain or palpitations.  Gastrointestinal: Negative for abdominal pain, no bowel changes.  Musculoskeletal: Negative for gait problem or joint swelling.  Skin: Negative for rash.  Neurological: Negative for dizziness or headache.  No other specific complaints in a complete review of systems (except as listed in HPI above).    Objective  Vitals:   03/31/23 1007  BP: 112/76  Pulse: 87  Resp: 18  Temp: 98 F (36.7 C)  TempSrc: Oral  SpO2: 98%  Weight: 173 lb 12.8 oz (78.8 kg)  Height: 5\' 9"  (1.753 m)    Body mass index is 25.67 kg/m.  Physical Exam Constitutional: Patient appears well-developed and well-nourished. No distress.  HENT: Head: Normocephalic and atraumatic. Ears: B TMs ok, no erythema or effusion; Nose: Nose normal. Mouth/Throat: Oropharynx is clear and moist. No oropharyngeal exudate.  Eyes: Conjunctivae and EOM are normal. Pupils are equal, round, and reactive to light. No scleral icterus.  Neck: Normal range of motion. Neck supple. No JVD present. No thyromegaly present.  Cardiovascular: Normal rate, regular rhythm and normal heart sounds.  No murmur heard. No BLE edema. Pulmonary/Chest: Effort normal and breath sounds normal. No respiratory distress. Abdominal: Soft. Bowel sounds are normal, no distension. There is no tenderness. no masses Musculoskeletal: Normal range of motion, no joint effusions. No gross deformities Neurological: he is alert and oriented to person, place, and time. No cranial nerve deficit. Coordination, balance, strength, speech and gait are normal.  Skin: Skin is warm and dry. No rash  noted. No erythema.  Psychiatric: Patient has a normal mood and affect. behavior is normal. Judgment and thought content normal.   Recent Results (from the past 2160 hour(s))  Urine drugs of abuse  scrn w alc, routine (Ref Lab)     Status: None   Collection Time: 03/20/23 11:37 AM  Result Value Ref Range   Amphetamines, Urine Negative Cutoff=1000 ng/mL    Comment: Amphetamine test includes Amphetamine and Methamphetamine.   Barbiturate, Ur Negative Cutoff=300 ng/mL   Benzodiazepine Quant, Ur Negative Cutoff=300 ng/mL   Cannabinoid Quant, Ur Negative Cutoff=50 ng/mL   Cocaine (Metab.) Negative Cutoff=300 ng/mL   Opiate Quant, Ur Negative Cutoff=300 ng/mL    Comment: Opiate test includes Codeine and Morphine only.   Phencyclidine, Ur Negative Cutoff=25 ng/mL   Methadone Screen, Urine Negative Cutoff=300 ng/mL   Propoxyphene, Urine Negative Cutoff=300 ng/mL   Ethanol U, Quan Negative Cutoff=0.020 %    Comment: (NOTE) Performed At: [facility] [address] [place], [place] [id number] Avis Epley PhD JX:[id number]      Fall Risk:    03/31/2023   10:10 AM 09/19/2022    1:09 PM 03/19/2022   12:55 PM 10/02/2021    8:23 AM 01/30/2021   10:15 AM  Fall Risk   Falls in the past year? 0 0 0 0   Number falls in past yr: 0 0 0 0 0  Injury with Fall? 0 0 0 0 0  Risk for fall due to :    No Fall Risks   Follow up  Falls evaluation completed Falls evaluation completed Falls prevention discussed       Functional Status Survey: Is the patient deaf or have difficulty hearing?: No Does the patient have difficulty seeing, even when wearing glasses/contacts?: No Does the patient have difficulty concentrating, remembering, or making decisions?: No Does the patient have difficulty walking or climbing stairs?: No Does the patient have difficulty dressing or bathing?: No Does the patient have difficulty doing errands alone such as visiting a doctor's office or shopping?: No    Assessment & Plan  1. Annual physical exam  - CBC with Differential/Platelet - COMPLETE METABOLIC PANEL WITH GFR - Lipid panel - Hepatitis C antibody - HIV Antibody (routine testing w rflx) - Hemoglobin  A1c  2. Screening for HIV without presence of risk factors  - HIV Antibody (routine testing w rflx)  3. Encounter for hepatitis C screening test for low risk patient  - Hepatitis C antibody  4. Screening for diabetes mellitus  - COMPLETE METABOLIC PANEL WITH GFR - Hemoglobin A1c  5. Screening for cholesterol level  - Lipid panel  6. Screening for deficiency anemia  - CBC with Differential/Platelet    -Prostate cancer screening and PSA options (with potential risks and benefits of testing vs not testing) were discussed along with recent recs/guidelines. -USPSTF grade A and B recommendations reviewed with patient; age-appropriate recommendations, preventive care, screening tests, etc discussed and encouraged; healthy living encouraged; see AVS for patient education given to patient -Discussed importance of 150 minutes of physical activity weekly, eat two servings of fish weekly, eat one serving of tree nuts ( cashews, pistachios, pecans, almonds.Marland Kitchen) every other day, eat 6 servings of fruit/vegetables daily and drink plenty of water and avoid sweet beverages.

## 2023-04-01 LAB — COMPLETE METABOLIC PANEL WITH GFR
AG Ratio: 1.8 (calc) (ref 1.0–2.5)
ALT: 7 U/L — ABNORMAL LOW (ref 9–46)
AST: 13 U/L (ref 10–40)
Albumin: 4.5 g/dL (ref 3.6–5.1)
Alkaline phosphatase (APISO): 47 U/L (ref 36–130)
BUN: 15 mg/dL (ref 7–25)
CO2: 28 mmol/L (ref 20–32)
Calcium: 10 mg/dL (ref 8.6–10.3)
Chloride: 103 mmol/L (ref 98–110)
Creat: 0.86 mg/dL (ref 0.60–1.26)
Globulin: 2.5 g/dL (calc) (ref 1.9–3.7)
Glucose, Bld: 90 mg/dL (ref 65–99)
Potassium: 4.2 mmol/L (ref 3.5–5.3)
Sodium: 139 mmol/L (ref 135–146)
Total Bilirubin: 0.8 mg/dL (ref 0.2–1.2)
Total Protein: 7 g/dL (ref 6.1–8.1)
eGFR: 114 mL/min/{1.73_m2} (ref >=60)

## 2023-04-01 LAB — CBC WITH DIFFERENTIAL/PLATELET
Absolute Monocytes: 584 cells/uL (ref 200–950)
Basophils Absolute: 80 cells/uL (ref 0–200)
Basophils Relative: 1.1 %
Eosinophils Relative: 0.4 %
MCH: 28.8 pg (ref 27.0–33.0)
MPV: 9.1 fL (ref 7.5–12.5)
Monocytes Relative: 8 %

## 2023-04-01 LAB — HEPATITIS C ANTIBODY: Hepatitis C Ab: NONREACTIVE

## 2023-04-01 LAB — HEMOGLOBIN A1C
Hgb A1c MFr Bld: 5.9 % of total Hgb — ABNORMAL HIGH (ref <5.7)
Mean Plasma Glucose: 123 mg/dL
eAG (mmol/L): 6.8 mmol/L

## 2023-04-01 LAB — LIPID PANEL
Cholesterol: 158 mg/dL (ref <200)
HDL: 40 mg/dL (ref >=40)
LDL Cholesterol (Calc): 101 mg/dL (calc) — ABNORMAL HIGH
Non-HDL Cholesterol (Calc): 118 mg/dL (calc) (ref <130)
Total CHOL/HDL Ratio: 4 (calc) (ref <5.0)
Triglycerides: 83 mg/dL (ref <150)

## 2023-04-01 LAB — HIV ANTIBODY (ROUTINE TESTING W REFLEX): HIV 1&2 Ab, 4th Generation: NONREACTIVE

## 2023-04-07 ENCOUNTER — Other Ambulatory Visit: Payer: Self-pay | Admitting: Psychiatry

## 2023-04-07 DIAGNOSIS — F411 Generalized anxiety disorder: Secondary | ICD-10-CM

## 2023-04-22 ENCOUNTER — Encounter: Payer: Self-pay | Admitting: Psychiatry

## 2023-04-22 ENCOUNTER — Ambulatory Visit (INDEPENDENT_AMBULATORY_CARE_PROVIDER_SITE_OTHER): Payer: BC Managed Care – PPO | Admitting: Psychiatry

## 2023-04-22 VITALS — BP 107/69 | HR 80 | Temp 98.4°F | Ht 69.0 in | Wt 170.0 lb

## 2023-04-22 DIAGNOSIS — F1590 Other stimulant use, unspecified, uncomplicated: Secondary | ICD-10-CM

## 2023-04-22 DIAGNOSIS — F902 Attention-deficit hyperactivity disorder, combined type: Secondary | ICD-10-CM

## 2023-04-22 DIAGNOSIS — F17200 Nicotine dependence, unspecified, uncomplicated: Secondary | ICD-10-CM | POA: Diagnosis not present

## 2023-04-22 DIAGNOSIS — F411 Generalized anxiety disorder: Secondary | ICD-10-CM | POA: Diagnosis not present

## 2023-04-22 MED ORDER — LISDEXAMFETAMINE DIMESYLATE 20 MG PO CAPS
20.0000 mg | ORAL_CAPSULE | Freq: Every morning | ORAL | 0 refills | Status: DC
Start: 2023-04-22 — End: 2023-05-07

## 2023-04-22 NOTE — Progress Notes (Unsigned)
BH MD OP Progress Note  04/22/2023 3:58 PM Jeffery Gomez  MRN:  [id number]  Chief Complaint:  Chief Complaint  Patient presents with   Follow-up   Anxiety   ADD   Medication Refill   HPI: Jeffery Gomez is a 37 year old Caucasian male, married, married, lives in [place], has a history of GAD, attention and focus deficit, caffeine use disorder, tobacco use disorder, recent diagnosis of obstructive sleep apnea currently not on CPAP was evaluated in office today.  Patient today reports he is currently improving with regards to his attention and focus on the Vyvanse 10 mg.  He is tolerating it well.  He had sleep issues recently when he took it too late at around 11 AM.  He reports his anxiety has improved since being on the Vyvanse.  He is needing less caffeine as well as is cutting back on smoking.  Patient otherwise denies any significant concerns.  Currently compliant on the Lexapro.  Denies side effects.  Has not needed the propranolol as needed.  Patient denies any suicidality, homicidality or perceptual disturbances.  Patient denies any other concerns today.  Visit Diagnosis:    ICD-10-CM   1. Generalized anxiety disorder  F41.1     2. Attention deficit hyperactivity disorder (ADHD), combined type  F90.2 lisdexamfetamine (VYVANSE) 20 MG capsule    3. 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 Tobacco use disorder  F17.200       Past Psychiatric History: Reviewed past psychiatric history from progress note on 04/24/2022.  Past trials of medications like BuSpar-side effect, Depakote-side effect, Lexapro, Zyprexa.  Past Medical History:  Past Medical History:  Diagnosis Date   Anxiety    Bipolar disorder (HCC)    Panic attacks     Past Surgical History:  Procedure Laterality Date   NO PAST SURGERIES      Family Psychiatric History: Reviewed family psychiatric history from progress note on 04/24/2022.  Family History:  Family History  Problem Relation Age of  Onset   Anxiety disorder Mother    Panic disorder Mother    Bipolar disorder Father    Schizophrenia Father    Alcohol abuse Brother    Bipolar disorder Brother    ADD / ADHD Brother    Anxiety disorder Brother    Bipolar disorder Maternal Aunt    Alcohol abuse Maternal Grandfather    Cirrhosis Maternal Grandfather    Heart attack Maternal Grandmother    Bipolar disorder Paternal Grandmother    Thyroid disease Paternal Grandmother     Social History: Reviewed social history from progress note on 04/24/2022. Social History   Socioeconomic History   Marital status: Married    Spouse name: Chelsie   Number of children: 3   Years of education: 11 TH GRADE   Highest education level: Not on file  Occupational History   Not on file  Tobacco Use   Smoking status: Some Days    Types: Cigarettes   Smokeless tobacco: Current    Types: Chew   Tobacco comments:    1 pack per week--03/20/2022  Vaping Use   Vaping Use: Never used  Substance and Sexual Activity   Alcohol use: Yes    Comment: occasinal   Drug use: No   Sexual activity: Yes    Partners: Female  Other Topics Concern   Not on file  Social History Narrative   Works at [employer] investment banker Strain:  Low Risk  (03/31/2023)   Overall Financial Resource Strain (CARDIA)    Difficulty of Paying Living Expenses: Not hard at all  Food Insecurity: No Food Insecurity (03/31/2023)   Hunger Vital Sign    Worried About Running Out of Food in the Last Year: Never true    Ran Out of Food in the Last Year: Never true  Transportation Needs: No Transportation Needs (03/31/2023)   PRAPARE - Administrator, Civil Service (Medical): No    Lack of Transportation (Non-Medical): No  Physical Activity: Insufficiently Active (03/31/2023)   Exercise Vital Sign    Days of Exercise per Week: 3 days    Minutes of Exercise per Session: 30 min  Stress: No Stress Concern Present (03/31/2023)    [employer] of Occupational Health - Occupational Stress Questionnaire    Feeling of Stress : Only a little  Social Connections: Moderately Integrated (03/31/2023)   Social Connection and Isolation Panel [NHANES]    Frequency of Communication with Friends and Family: More than three times a week    Frequency of Social Gatherings with Friends and Family: More than three times a week    Attends Religious Services: 1 to 4 times per year    Active Member of [employer] or Organizations: No    Attends Banker Meetings: Never    Marital Status: Married    Allergies:  Allergies  Allergen Reactions   Depakote [Divalproex Sodium] Anaphylaxis    Metabolic Disorder Labs: Lab Results  Component Value Date   HGBA1C 5.9 (H) 03/31/2023   MPG 123 03/31/2023   No results found for: "PROLACTIN" Lab Results  Component Value Date   CHOL 158 03/31/2023   TRIG 83 03/31/2023   HDL 40 03/31/2023   CHOLHDL 4.0 03/31/2023   LDLCALC 101 (H) 03/31/2023   Lab Results  Component Value Date   TSH 1.12 03/19/2022   TSH 1.03 12/30/2017    Therapeutic Level Labs: No results found for: "LITHIUM" No results found for: "VALPROATE" No results found for: "CBMZ"  Current Medications: Current Outpatient Medications  Medication Sig Dispense Refill   Azelastine HCl 137 MCG/SPRAY SOLN SMARTSIG:1-2 Spray(s) Both Nares Every 12 Hours PRN     cefdinir (OMNICEF) 300 MG capsule Take 300 mg by mouth 2 (two) times daily.     clobetasol (TEMOVATE) 0.05 % external solution SMARTSIG:Sparingly Topical Twice Daily     escitalopram (LEXAPRO) 10 MG tablet TAKE 1.5 TABLETS (15 MG TOTAL) BY MOUTH DAILY WITH BREAKFAST. 135 tablet 0   fluticasone (FLONASE) 50 MCG/ACT nasal spray Place 2 sprays into both nostrils daily. 16 g 6   lisdexamfetamine (VYVANSE) 20 MG capsule Take 1 capsule (20 mg total) by mouth in the morning. 30 capsule 0   propranolol (INDERAL) 20 MG tablet TAKE 1 TABLET (20 MG TOTAL) BY MOUTH 2  (TWO) TIMES DAILY AS NEEDED. FOR ANXIETY 180 tablet 1   No current facility-administered medications for this visit.     Musculoskeletal: Strength & Muscle Tone: within normal limits Gait & Station: normal Patient leans: N/A  Psychiatric Specialty Exam: Review of Systems  Psychiatric/Behavioral:  Positive for decreased concentration (Improving).     Blood pressure 107/69, pulse 80, temperature 98.4 F (36.9 C), temperature source Skin, height 5\' 9"  (1.753 m), weight 170 lb (77.1 kg).Body mass index is 25.1 kg/m.  General Appearance: Fairly Groomed  Eye Contact:  Fair  Speech:  Clear and Coherent  Volume:  Normal  Mood:  Euthymic  Affect:  Appropriate  Thought Process:  Goal Directed and Descriptions of Associations: Intact  Orientation:  Full (Time, Place, and Person)  Thought Content: Logical   Suicidal Thoughts:  No  Homicidal Thoughts:  No  Memory:  Immediate;   Fair Recent;   Fair Remote;   Fair  Judgement:  Fair  Insight:  Fair  Psychomotor Activity:  Normal  Concentration:  Concentration: Fair and Attention Span: Fair  Recall:  [employer] of Knowledge: Fair  Language: Fair  Akathisia:  No  Handed:  Right  AIMS (if indicated): not done  Assets:  Communication Skills Desire for Improvement Housing Intimacy Social Support Talents/Skills Transportation  ADL's:  Intact  Cognition: WNL  Sleep:  Fair   Screenings: GAD-7    Garment/textile technologist Visit from 04/22/2023 in [facility] Office Visit from 03/31/2023 in [facility] Video Visit from 12/26/2022 in [facility] Video Visit from 11/13/2022 in [facility] Office Visit from 09/19/2022 in [facility]  Total GAD-7 Score 5 0 14 18 16       PHQ2-9    Flowsheet Row Office Visit from 04/22/2023 in [facility] Office Visit from 03/31/2023 in [facility] Visit from 11/13/2022 in [facility] Office Visit from 09/19/2022 in [facility] Office Visit from 07[facility]23/2022 in [facility]  PHQ-2 Total Score 2 0 1 2 2   PHQ-9 Total Score 5 0 -- 10 9      Flowsheet Row Office Visit from 04/22/2023 in [facility] Video Visit from 03/03/2023 in [facility] Video Visit from 12/26/2022 in [facility]  C-SSRS RISK CATEGORY No Risk No Risk No Risk        Assessment and Plan: AADHI SMYSER is a 37 year old Caucasian male, married, employed, lives in [place], has a history of GAD, ADHD, obstructive sleep apnea was evaluated in office today.  Patient is currently improving on the Vyvanse, discussed plan as noted below.  Plan GAD-stable Lexapro 15 mg p.o. daily Propranolol 20 mg p.o. twice daily as needed  ADHD combined type-improving Will increase Vyvanse to 20 mg p.o. daily. Reviewed [place] PMP AWARxE Urine drug screen reviewed-03/20/2023-negative EKG-normal sinus rhythm.  Caffeine use disorder mild-improving Patient is currently cutting back.  Tobacco use disorder-improving Patient to continue to work on cutting back.  Follow-up in clinic in 2 months or sooner in person.   Consent: Patient/Guardian gives verbal consent for treatment and assignment of benefits for services provided during this visit. Patient/Guardian expressed understanding and agreed to proceed.   This note was generated in part or whole with voice recognition software. Voice recognition is usually quite accurate but there are transcription errors that can and very often do occur. I apologize for any typographical errors that were not detected and corrected.    Jomarie Longs, MD 04/23/2023, 10:00 AM

## 2023-05-07 ENCOUNTER — Other Ambulatory Visit: Payer: Self-pay | Admitting: Child and Adolescent Psychiatry

## 2023-05-07 DIAGNOSIS — F902 Attention-deficit hyperactivity disorder, combined type: Secondary | ICD-10-CM

## 2023-05-07 MED ORDER — LISDEXAMFETAMINE DIMESYLATE 20 MG PO CAPS: 20.0000 mg | ORAL_CAPSULE | Freq: Every morning | ORAL | 0 refills | Status: DC

## 2023-05-07 NOTE — Telephone Encounter (Signed)
From: [name] To: Office of Jomarie Longs, MD Sent: 05/07/2023 2:18 PM EDT Subject: Medication Renewal Request  Refills have been requested for the following medications:   lisdexamfetamine (VYVANSE) 20 MG capsule [Saramma Eappen]  Patient Comment: You recommended I ask for refill before your vacation I've tried calling but only voicemail   Preferred pharmacy: [id number] [address], [place] [phone] [address] Delivery method: Daryll Drown

## 2023-06-24 ENCOUNTER — Ambulatory Visit: Payer: BC Managed Care – PPO | Admitting: Psychiatry

## 2023-07-02 ENCOUNTER — Other Ambulatory Visit: Payer: Self-pay | Admitting: Oncology

## 2023-07-02 DIAGNOSIS — Z006 Encounter for examination for normal comparison and control in clinical research program: Secondary | ICD-10-CM

## 2023-07-09 ENCOUNTER — Other Ambulatory Visit
Admission: RE | Admit: 2023-07-09 | Discharge: 2023-07-09 | Disposition: A | Discharge status: Home / Self Care | Payer: BC Managed Care – PPO | Attending: Oncology | Admitting: Oncology

## 2023-07-09 DIAGNOSIS — Z006 Encounter for examination for normal comparison and control in clinical research program: Secondary | ICD-10-CM

## 2023-07-19 LAB — GENECONNECT MOLECULAR SCREEN: Genetic Analysis Overall Interpretation: NEGATIVE

## 2023-07-30 ENCOUNTER — Ambulatory Visit: Payer: BC Managed Care – PPO | Admitting: Psychiatry

## 2023-08-11 ENCOUNTER — Other Ambulatory Visit: Payer: Self-pay | Admitting: Child and Adolescent Psychiatry

## 2023-08-11 DIAGNOSIS — F902 Attention-deficit hyperactivity disorder, combined type: Secondary | ICD-10-CM

## 2023-08-11 MED ORDER — LISDEXAMFETAMINE DIMESYLATE 20 MG PO CAPS
20.0000 mg | ORAL_CAPSULE | Freq: Every morning | ORAL | 0 refills | Status: DC
Start: 2023-08-11 — End: 2023-08-26

## 2023-08-26 ENCOUNTER — Encounter: Payer: Self-pay | Admitting: Psychiatry

## 2023-08-26 ENCOUNTER — Ambulatory Visit: Payer: BC Managed Care – PPO | Admitting: Psychiatry

## 2023-08-26 VITALS — BP 126/82 | HR 98 | Temp 98.2°F | Ht 69.0 in | Wt 167.0 lb

## 2023-08-26 DIAGNOSIS — F1590 Other stimulant use, unspecified, uncomplicated: Secondary | ICD-10-CM | POA: Diagnosis not present

## 2023-08-26 DIAGNOSIS — F902 Attention-deficit hyperactivity disorder, combined type: Secondary | ICD-10-CM | POA: Diagnosis not present

## 2023-08-26 DIAGNOSIS — F411 Generalized anxiety disorder: Secondary | ICD-10-CM | POA: Diagnosis not present

## 2023-08-26 DIAGNOSIS — F17200 Nicotine dependence, unspecified, uncomplicated: Secondary | ICD-10-CM | POA: Diagnosis not present

## 2023-08-26 MED ORDER — LISDEXAMFETAMINE DIMESYLATE 30 MG PO CAPS
30.0000 mg | ORAL_CAPSULE | Freq: Every morning | ORAL | 0 refills | Status: DC
Start: 2023-08-26 — End: 2023-09-26

## 2023-08-26 NOTE — Progress Notes (Unsigned)
BH MD OP Progress Note  08/26/2023 9:07 AM Jeffery Gomez  MRN:  [id number]  Chief Complaint:  Chief Complaint  Patient presents with   Follow-up   ADD   Anxiety   Medication Refill   HPI: Jeffery Gomez is a 37 year old Caucasian male, married, lives in [place], has a history of GAD, attention and focus deficit, caffeine use disorder, tobacco use disorder, obstructive sleep apnea was evaluated in office today.  Patient today reports he is currently compliant on the Vyvanse 20 mg.  Patient reports he ran out of the Vyvanse for a few weeks and then he got back on it he could feel a difference.  He knows the Vyvanse is helpful however does not know if the 20 mg is as helpful as it used to be before.  Patient reports he has noticed a difference with his attention and focus at work.  He however has to stay on track to keep himself from being distracted.  Patient does report reduced appetite however reports it is likely also because he keeps busy throughout the day and travels a lot for work that he forgets to take scheduled meals.  Patient reports at the end of the day he has a good dinner.  He has not lost much weight since his last visit, around 2 to 3 pounds mostly.  Patient does report sleep struggles on and off mostly because of lacking a good sleep hygiene.  He wonders whether he can take melatonin.  He watches TV and stays on his phone sometimes which affects his sleep.  Patient does take an energy drink in the morning.  Reports he has started cutting back on caffeine use.  Patient reports he is also cutting back on smoking cigarettes.  He smoked a cigarette this morning however did not smoke for several weeks prior to that.  He does have propranolol as needed available however has not been using it.  Stopped taking the Lexapro several months ago since he felt he did not need it.  Patient denies any suicidality, homicidality or perceptual disturbances.  Patient denies any  other concerns today.  Visit Diagnosis:    ICD-10-CM   1. Generalized anxiety disorder  F41.1     2. Attention deficit hyperactivity disorder (ADHD), combined type  F90.2 lisdexamfetamine (VYVANSE) 30 MG capsule    3. 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 Tobacco use disorder  F17.200       Past Psychiatric History: I have reviewed past psychiatric history from progress note on 04/24/2022.  Past trials of medications like BuSpar-side effect, Depakote-side effect, Lexapro, Zyprexa.  Past Medical History:  Past Medical History:  Diagnosis Date   Anxiety    Bipolar disorder (HCC)    Panic attacks     Past Surgical History:  Procedure Laterality Date   NO PAST SURGERIES      Family Psychiatric History: I have reviewed family psychiatric history from progress note on 04/24/2022.  Family History:  Family History  Problem Relation Age of Onset   Anxiety disorder Mother    Panic disorder Mother    Bipolar disorder Father    Schizophrenia Father    Alcohol abuse Brother    Bipolar disorder Brother    ADD / ADHD Brother    Anxiety disorder Brother    Bipolar disorder Maternal Aunt    Alcohol abuse Maternal Grandfather    Cirrhosis Maternal Grandfather    Heart attack Maternal Grandmother    Bipolar  disorder Paternal Grandmother    Thyroid disease Paternal Grandmother     Social History: I have reviewed social history from progress note on 04/24/2022. Social History   Socioeconomic History   Marital status: Married    Spouse name: Chelsie   Number of children: 3   Years of education: 11 TH GRADE   Highest education level: Not on file  Occupational History   Not on file  Tobacco Use   Smoking status: Some Days    Types: Cigarettes   Smokeless tobacco: Current    Types: Chew   Tobacco comments:    1 pack per week--03/20/2022  Vaping Use   Vaping status: Never Used  Substance and Sexual Activity   Alcohol use: Yes    Comment: occasinal   Drug use: No   Sexual  activity: Yes    Partners: Female  Other Topics Concern   Not on file  Social History Narrative   Works at [employer] Strain: Low Risk  (03/31/2023)   Overall Financial Resource Strain (CARDIA)    Difficulty of Paying Living Expenses: Not hard at all  Food Insecurity: No Food Insecurity (03/31/2023)   Hunger Vital Sign    Worried About Running Out of Food in the Last Year: Never true    Ran Out of Food in the Last Year: Never true  Transportation Needs: No Transportation Needs (03/31/2023)   PRAPARE - Administrator, Civil Service (Medical): No    Lack of Transportation (Non-Medical): No  Physical Activity: Insufficiently Active (03/31/2023)   Exercise Vital Sign    Days of Exercise per Week: 3 days    Minutes of Exercise per Session: 30 min  Stress: No Stress Concern Present (03/31/2023)   [employer] of Occupational Health - Occupational Stress Questionnaire    Feeling of Stress : Only a little  Social Connections: Moderately Integrated (03/31/2023)   Social Connection and Isolation Panel [NHANES]    Frequency of Communication with Friends and Family: More than three times a week    Frequency of Social Gatherings with Friends and Family: More than three times a week    Attends Religious Services: 1 to 4 times per year    Active Member of [employer] or Organizations: No    Attends Banker Meetings: Never    Marital Status: Married    Allergies:  Allergies  Allergen Reactions   Depakote [Divalproex Sodium] Anaphylaxis    Metabolic Disorder Labs: Lab Results  Component Value Date   HGBA1C 5.9 (H) 03/31/2023   MPG 123 03/31/2023   No results found for: "PROLACTIN" Lab Results  Component Value Date   CHOL 158 03/31/2023   TRIG 83 03/31/2023   HDL 40 03/31/2023   CHOLHDL 4.0 03/31/2023   LDLCALC 101 (H) 03/31/2023   Lab Results  Component Value Date   TSH 1.12 03/19/2022   TSH 1.03 12/30/2017     Therapeutic Level Labs: No results found for: "LITHIUM" No results found for: "VALPROATE" No results found for: "CBMZ"  Current Medications: Current Outpatient Medications  Medication Sig Dispense Refill   Azelastine HCl 137 MCG/SPRAY SOLN SMARTSIG:1-2 Spray(s) Both Nares Every 12 Hours PRN     clobetasol (TEMOVATE) 0.05 % external solution SMARTSIG:Sparingly Topical Twice Daily     fluticasone (FLONASE) 50 MCG/ACT nasal spray Place 2 sprays into both nostrils daily. 16 g 6   lisdexamfetamine (VYVANSE) 30 MG capsule Take 1 capsule (30 mg  total) by mouth in the morning. 30 capsule 0   propranolol (INDERAL) 20 MG tablet TAKE 1 TABLET (20 MG TOTAL) BY MOUTH 2 (TWO) TIMES DAILY AS NEEDED. FOR ANXIETY 180 tablet 1   No current facility-administered medications for this visit.     Musculoskeletal: Strength & Muscle Tone: within normal limits Gait & Station: normal Patient leans: N/A  Psychiatric Specialty Exam: Review of Systems  Psychiatric/Behavioral:  Positive for decreased concentration and sleep disturbance.     Blood pressure 126/82, pulse 98, temperature 98.2 F (36.8 C), temperature source Skin, height 5\' 9"  (1.753 m), weight 167 lb (75.8 kg).Body mass index is 24.66 kg/m.  General Appearance: Fairly Groomed  Eye Contact:  Fair  Speech:  Clear and Coherent  Volume:  Normal  Mood:  Euthymic  Affect:  Congruent  Thought Process:  Goal Directed and Descriptions of Associations: Intact  Orientation:  Full (Time, Place, and Person)  Thought Content: Logical   Suicidal Thoughts:  No  Homicidal Thoughts:  No  Memory:  Immediate;   Fair Recent;   Fair Remote;   Fair  Judgement:  Fair  Insight:  Fair  Psychomotor Activity:  Normal  Concentration:  Concentration: Fair and Attention Span: Fair  Recall:  [employer] of Knowledge: Fair  Language: Fair  Akathisia:  No  Handed:  Right  AIMS (if indicated): not done  Assets:  Communication Skills Desire for  Improvement Housing Social Support  ADL's:  Intact  Cognition: WNL  Sleep:   Interrupted   Screenings: GAD-7    Flowsheet Row Office Visit from 08/26/2023 in [facility] Office Visit from 04/22/2023 in [facility] Office Visit from 03/31/2023 in [facility] Video Visit from 12/26/2022 in [facility] Video Visit from 11/13/2022 in [facility]  Total GAD-7 Score 7 5 0 14 18      PHQ2-9    Flowsheet Row Office Visit from 08/26/2023 in [facility] Office Visit from 04/22/2023 in [facility] Office Visit from 03/31/2023 in [facility] Video Visit from 11/13/2022 in [facility] Office Visit from 09/19/2022 in [facility]  PHQ-2 Total Score 2 2 0 1 2  PHQ-9 Total Score 7 5 0 -- 10      Flowsheet Row Office Visit from 08/26/2023 in [facility] Office Visit from 04/22/2023 in [facility] Video Visit from 03/03/2023 in [facility]  C-SSRS RISK CATEGORY No Risk No Risk No Risk        Assessment and Plan: Jeffery Gomez is a 36 year old Caucasian male, married, employed, lives in [place], has a history of GAD, ADHD, obstructive sleep apnea was evaluated in office today.  Patient currently noncompliant on Lexapro continues to have attention and focus deficit, however with good response to Vyvanse, will benefit from dosage increase as well as working on sleep hygiene, plan as noted below.  Plan GAD-stable Discontinue Lexapro for noncompliance. Propranolol 20 mg p.o. twice daily as needed  ADHD combined  type-unstable Will increase Vyvanse to 30 mg p.o. daily Reviewed [place] PMP AWARxE UDS- 03/20/23-negative. Discussed sleep hygiene techniques.  Patient could take melatonin at night to help with sleep.  Caffeine use disorder-improving Patient currently cutting back.  Tobacco use disorder-improving Patient continues  to work on smoking cessation. Provided counseling for 1 minute.  Follow-up in clinic in 1 month or sooner if needed.      Consent: Patient/Guardian gives verbal consent for treatment and assignment of benefits for services provided during this visit. Patient/Guardian expressed understanding and agreed to proceed.   This note was generated in part or whole with voice recognition software. Voice recognition is usually quite accurate but there are transcription errors that can and very often do occur. I apologize for any typographical errors that were not detected and corrected.      Jomarie Longs, MD 08/26/2023, 9:07 AM

## 2023-09-26 ENCOUNTER — Encounter: Payer: Self-pay | Admitting: Psychiatry

## 2023-09-26 ENCOUNTER — Telehealth (INDEPENDENT_AMBULATORY_CARE_PROVIDER_SITE_OTHER): Payer: BC Managed Care – PPO | Admitting: Psychiatry

## 2023-09-26 DIAGNOSIS — F411 Generalized anxiety disorder: Secondary | ICD-10-CM | POA: Diagnosis not present

## 2023-09-26 DIAGNOSIS — F902 Attention-deficit hyperactivity disorder, combined type: Secondary | ICD-10-CM | POA: Diagnosis not present

## 2023-09-26 DIAGNOSIS — F1590 Other stimulant use, unspecified, uncomplicated: Secondary | ICD-10-CM

## 2023-09-26 DIAGNOSIS — G4730 Sleep apnea, unspecified: Secondary | ICD-10-CM

## 2023-09-26 DIAGNOSIS — G4701 Insomnia due to medical condition: Secondary | ICD-10-CM | POA: Diagnosis not present

## 2023-09-26 DIAGNOSIS — F17220 Nicotine dependence, chewing tobacco, uncomplicated: Secondary | ICD-10-CM

## 2023-09-26 DIAGNOSIS — F17200 Nicotine dependence, unspecified, uncomplicated: Secondary | ICD-10-CM

## 2023-09-26 DIAGNOSIS — F17210 Nicotine dependence, cigarettes, uncomplicated: Secondary | ICD-10-CM

## 2023-09-26 MED ORDER — AMPHETAMINE-DEXTROAMPHET ER 20 MG PO CP24
20.0000 mg | ORAL_CAPSULE | Freq: Every morning | ORAL | 0 refills | Status: DC
Start: 2023-09-26 — End: 2023-10-24

## 2023-09-26 NOTE — Progress Notes (Signed)
Virtual Visit via Video Note  I connected with [name] on 09/26/23 at 10:00 AM EST by a video enabled telemedicine application and verified that I am speaking with the correct person using two identifiers.  Location Provider Location : [facility] Patient Location : Home  Participants: Patient , Provider   I discussed the limitations of evaluation and management by telemedicine and the availability of in person appointments. The patient expressed understanding and agreed to proceed.   I discussed the assessment and treatment plan with the patient. The patient was provided an opportunity to ask questions and all were answered. The patient agreed with the plan and demonstrated an understanding of the instructions.   The patient was advised to call back or seek an in-person evaluation if the symptoms worsen or if the condition fails to improve as anticipated.                                                          BH MD OP Progress Note  09/26/2023 10:37 AM MYREL GWATHNEY  MRN:  [id number]  Chief Complaint:  Chief Complaint  Patient presents with   Anxiety   Medication Refill   ADD   Medication Problem   HPI: EZRIEL HARBEN is a 37 year old Caucasian male, lives in [place], has a history of GAD, attention and focus deficit, caffeine use disorder, tobacco use disorder, obstructive sleep apnea was evaluated by telemedicine today.  Patient today reports the Vyvanse higher dosage of 30 mg which he started taking 4 weeks ago has been giving him side effects.  He reports he feels more anxious, on edge all the time.  Patient reports he feels like he is unable to relax on the higher dosage.  It is also suppressing his appetite.  He has noticed sleep as restless as well.  Patient reports he hence has been taking propranolol as needed.  He does not believe the higher dosage of Vyvanse has been helpful for his attention and focus as well.  It is likely his anxiety symptoms are  overwhelming him and affecting his concentration.  Patient agreeable to trial of Adderall extended release.  He has never tried it before.  He is no longer taking the Lexapro, noncompliant with it since the past several weeks.  Patient denies any suicidality, homicidality or perceptual disturbances.  Patient reports he has not been drinking energy drinks anymore.  He also has been cutting back on caffeine use.  He tries to drink water later on during the day.  Patient reports he is using smokeless tobacco, trying to cut back on smoking.  Patient denies any other concerns today.  Visit Diagnosis:    ICD-10-CM   1. Generalized anxiety disorder  F41.1     2. Attention deficit hyperactivity disorder (ADHD), combined type  F90.2 amphetamine-dextroamphetamine (ADDERALL XR) 20 MG 24 hr capsule    3. Insomnia due to medical condition  G47.01    Multifactorial inclduing sleep apnea not compliant on CPAP    4. 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 Tobacco use disorder  F17.200       Past Psychiatric History: I have reviewed past psychiatric history from progress note on 04/24/2022.  Past trials of medications like BuSpar-side effect, Depakote-side effect, Lexapro, Zyprexa, Vyvanse.  Past Medical History:  Past Medical History:  Diagnosis Date   Anxiety    Bipolar disorder (HCC)    Panic attacks     Past Surgical History:  Procedure Laterality Date   NO PAST SURGERIES      Family Psychiatric History: I have reviewed family psychiatric history from progress note on 04/24/2022.  Family History:  Family History  Problem Relation Age of Onset   Anxiety disorder Mother    Panic disorder Mother    Bipolar disorder Father    Schizophrenia Father    Alcohol abuse Brother    Bipolar disorder Brother    ADD / ADHD Brother    Anxiety disorder Brother    Bipolar disorder Maternal Aunt    Alcohol abuse Maternal Grandfather    Cirrhosis Maternal Grandfather    Heart attack Maternal  Grandmother    Bipolar disorder Paternal Grandmother    Thyroid disease Paternal Grandmother     Social History: I have reviewed social history from progress note on 04/24/2022. Social History   Socioeconomic History   Marital status: Married    Spouse name: Chelsie   Number of children: 3   Years of education: 11 TH GRADE   Highest education level: Not on file  Occupational History   Not on file  Tobacco Use   Smoking status: Some Days    Types: Cigarettes   Smokeless tobacco: Current    Types: Chew   Tobacco comments:    1 pack per week--03/20/2022  Vaping Use   Vaping status: Never Used  Substance and Sexual Activity   Alcohol use: Yes    Comment: occasinal   Drug use: No   Sexual activity: Yes    Partners: Female  Other Topics Concern   Not on file  Social History Narrative   Works at [employer] Strain: Low Risk  (03/31/2023)   Overall Financial Resource Strain (CARDIA)    Difficulty of Paying Living Expenses: Not hard at all  Food Insecurity: No Food Insecurity (03/31/2023)   Hunger Vital Sign    Worried About Running Out of Food in the Last Year: Never true    Ran Out of Food in the Last Year: Never true  Transportation Needs: No Transportation Needs (03/31/2023)   PRAPARE - Administrator, Civil Service (Medical): No    Lack of Transportation (Non-Medical): No  Physical Activity: Insufficiently Active (03/31/2023)   Exercise Vital Sign    Days of Exercise per Week: 3 days    Minutes of Exercise per Session: 30 min  Stress: No Stress Concern Present (03/31/2023)   [employer] of Occupational Health - Occupational Stress Questionnaire    Feeling of Stress : Only a little  Social Connections: Moderately Integrated (03/31/2023)   Social Connection and Isolation Panel [NHANES]    Frequency of Communication with Friends and Family: More than three times a week    Frequency of Social Gatherings with  Friends and Family: More than three times a week    Attends Religious Services: 1 to 4 times per year    Active Member of [employer] or Organizations: No    Attends Banker Meetings: Never    Marital Status: Married    Allergies:  Allergies  Allergen Reactions   Depakote [Divalproex Sodium] Anaphylaxis    Metabolic Disorder Labs: Lab Results  Component Value Date   HGBA1C 5.9 (H) 03/31/2023   MPG 123 03/31/2023   No results found  for: "PROLACTIN" Lab Results  Component Value Date   CHOL 158 03/31/2023   TRIG 83 03/31/2023   HDL 40 03/31/2023   CHOLHDL 4.0 03/31/2023   LDLCALC 101 (H) 03/31/2023   Lab Results  Component Value Date   TSH 1.12 03/19/2022   TSH 1.03 12/30/2017    Therapeutic Level Labs: No results found for: "LITHIUM" No results found for: "VALPROATE" No results found for: "CBMZ"  Current Medications: Current Outpatient Medications  Medication Sig Dispense Refill   amphetamine-dextroamphetamine (ADDERALL XR) 20 MG 24 hr capsule Take 1 capsule (20 mg total) by mouth in the morning. Stop Vyvanse 30 capsule 0   Azelastine HCl 137 MCG/SPRAY SOLN SMARTSIG:1-2 Spray(s) Both Nares Every 12 Hours PRN     clobetasol (TEMOVATE) 0.05 % external solution SMARTSIG:Sparingly Topical Twice Daily     fluticasone (FLONASE) 50 MCG/ACT nasal spray Place 2 sprays into both nostrils daily. 16 g 6   propranolol (INDERAL) 20 MG tablet TAKE 1 TABLET (20 MG TOTAL) BY MOUTH 2 (TWO) TIMES DAILY AS NEEDED. FOR ANXIETY 180 tablet 1   No current facility-administered medications for this visit.     Musculoskeletal: Strength & Muscle Tone:  UTA Gait & Station:  Seated Patient leans: N/A  Psychiatric Specialty Exam: Review of Systems  Psychiatric/Behavioral:  Positive for decreased concentration and sleep disturbance. The patient is nervous/anxious.     There were no vitals taken for this visit.There is no height or weight on file to calculate BMI.  General  Appearance: Fairly Groomed  Eye Contact:  Fair  Speech:  Clear and Coherent  Volume:  Normal  Mood:  Anxious  Affect:  Congruent  Thought Process:  Goal Directed and Descriptions of Associations: Intact  Orientation:  Full (Time, Place, and Person)  Thought Content: Logical   Suicidal Thoughts:  No  Homicidal Thoughts:  No  Memory:  Immediate;   Fair Recent;   Fair Remote;   Fair  Judgement:  Fair  Insight:  Fair  Psychomotor Activity:  Normal  Concentration:  Concentration: Fair and Attention Span: Fair  Recall:  [employer] of Knowledge: Fair  Language: Fair  Akathisia:  No  Handed:  Right  AIMS (if indicated): not done  Assets:  Desire for Improvement Housing Intimacy Resilience Social Support Talents/Skills Transportation  ADL's:  Intact  Cognition: WNL  Sleep:   Restless   Screenings: GAD-7    Flowsheet Row Video Visit from 09/26/2023 in [facility] Office Visit from 08/26/2023 in [facility] Office Visit from 04/22/2023 in [facility] Office Visit from 03/31/2023 in [facility] Video Visit from 12/26/2022 in [facility]  Total GAD-7 Score 17 7 5  0 14      PHQ2-9    Flowsheet Row Office Visit from 08/26/2023 in [facility] Office Visit from 04/22/2023 in [facility] Office Visit from 03/31/2023 in [facility] Video Visit from 11/13/2022 in [facility] Office Visit from 09/19/2022 in [facility]  PHQ-2 Total Score 2 2 0 1 2  PHQ-9 Total Score 7 5 0 -- 10      Flowsheet Row Office Visit from 08/26/2023 in [facility] Office Visit from 04/22/2023 in [facility] Video Visit from 03/03/2023 in [facility]  C-SSRS RISK CATEGORY No Risk No Risk No Risk        Assessment and Plan: DECHLAN HORSTMANN is a 37 year old Caucasian male, married, employed, lives in [place], has a history of GAD, ADHD, obstructive sleep apnea was evaluated by telemedicine today.  Patient with worsening anxiety, sleep issues, other side effects as well as lack of benefit from Vyvanse higher dosage, will benefit from medication readjustment, plan as noted below.  Plan GAD-unstable Propranolol 20 mg p.o. twice daily as needed We will consider restarting Lexapro in the future.  ADHD-combined type-unstable Discontinue Vyvanse for lack of benefit and side effects. Start Adderall extended release 20 mg p.o. daily in the morning Reviewed [place] PMP AWARxE UDS-03/20/2023-negative Patient to continue to work on sleep hygiene techniques.  Insomnia-likely multifactorial including uncorrected sleep apnea-unstable Patient provided education. Will consider reevaluating in future sessions.  Caffeine use disorder-improving Provided counseling, patient is currently cutting back.  Tobacco use disorder-improving Patient provided smoking cessation counseling for 1 minute.  Patient is currently cutting back.  Follow-up in clinic in 1 week or sooner if needed.  Consent: Patient/Guardian gives verbal consent for treatment and assignment of benefits for services provided during this visit. Patient/Guardian expressed understanding and agreed to proceed.  This note was generated in part or whole with voice recognition software. Voice recognition is usually quite accurate but there are transcription errors that can and very often do occur. I apologize for any typographical errors that were not detected and corrected.     Jomarie Longs, MD 09/26/2023, 10:37 AM

## 2023-10-01 ENCOUNTER — Ambulatory Visit: Payer: BC Managed Care – PPO | Admitting: Nurse Practitioner

## 2023-10-01 NOTE — Progress Notes (Deleted)
There were no vitals taken for this visit.   Subjective:    Patient ID: Jeffery Gomez, male    DOB: 1986-01-19, 37 y.o.   MRN: [id number]  HPI: Jeffery Gomez is a 37 y.o. male, here alone  No chief complaint on file.   Depression/anxiety/ADHD Managed by psychiatry Medication adderall, propranolol  Compliant yes Side effects no PHQ9 *** GAD ***     08/26/2023    8:53 AM 04/22/2023    4:00 PM 03/31/2023   10:10 AM 11/13/2022    4:27 PM 09/19/2022    1:11 PM  Depression screen PHQ 2/9  Decreased Interest   0  1  Down, Depressed, Hopeless   0  1  PHQ - 2 Score   0  2  Altered sleeping   0  1  Tired, decreased energy   0  3  Change in appetite   0  0  Feeling bad or failure about yourself    0  1  Trouble concentrating   0  3  Moving slowly or fidgety/restless   0  0  Suicidal thoughts   0  0  PHQ-9 Score   0  10  Difficult doing work/chores   Not difficult at all  Somewhat difficult     Information is confidential and restricted. Go to Review Flowsheets to unlock data.       09/26/2023   10:22 AM 08/26/2023    8:53 AM 04/22/2023    4:01 PM 03/31/2023   10:11 AM  GAD 7 : Generalized Anxiety Score  Nervous, Anxious, on Edge    0  Control/stop worrying    0  Worry too much - different things    0  Trouble relaxing    0  Restless    0  Easily annoyed or irritable    0  Afraid - awful might happen    0  Total GAD 7 Score    0  Anxiety Difficulty    Not difficult at all     Information is confidential and restricted. Go to Review Flowsheets to unlock data.     Mild sleep apnea:  patient was seen by pulmonology and had sleep study done. He was told he has mild sleep apnea but not requiring cpap at this time.  Notes from pulmonology:  Home sleep study 05/04/22>> AHI 7.6/hour with SpO2 low 84% (average 93%). Apneas were mainly obstructive. Advised patient to focus on positional sleep, recommend he avoid back sleeping position or elevate HOB 30 degrees. We will also  refer him to orthodontics for consideration for oral appliance to help with snoring and mild apneas. He does not need CPAP right now unless his sleep symptoms worsen.  Advised against driving if experiencing excessive daytime sleepiness or fatigue.  Patient reports he went to a orthodontist to get the mouthguard and the mouthguard was $4000.  Patient decided against getting a mouthguard.  Patient states he did get some things that go up his nose to help open his airway off [employer].  Patient states he has noticed a difference with that.  Patient also tries really hard to sleep on his side as opposed to on his back.    Sinus issues:  He says he has been having issues with his sinuses for awhile.  He says he feels like his nasal passages are not as open as they should be and he says he feels like his sinuses are always dry.  He wanted  to see ENT, referral was placed but patient was a no show for his appointment.   Psoriasis: Patient reports he gets psoriasis on bilateral knees and he has a cream that he puts on that.  Patient's complaint now is that he has noticed that he has scalp pain does not go there.  Patient states he does have a shampoo at home but has not tried it yet patient is going to try that also discussed that up-to-date  recommends coal tar shampoo. Referral was placed to dermatology however patient never responded to them regarding scheduling an appointment per referral.   Relevant past medical, surgical, family and social history reviewed and updated as indicated. Interim medical history since our last visit reviewed. Allergies and medications reviewed and updated.  Review of Systems Constitutional: Negative for fever or weight change.  Respiratory: Negative for cough and shortness of breath.   Cardiovascular: Negative for chest pain or palpitations.  Gastrointestinal: Negative for abdominal pain, no bowel changes.  Musculoskeletal: Negative for gait problem or joint swelling.  Skin:  Negative for rash.  Positive for psoriasis of the scalp Neurological: Negative for dizziness or headache.  No other specific complaints in a complete review of systems (except as listed in HPI above).      Objective:    There were no vitals taken for this visit.  Wt Readings from Last 3 Encounters:  03/31/23 173 lb 12.8 oz (78.8 kg)  09/19/22 177 lb 11.2 oz (80.6 kg)  03/20/22 174 lb 6.4 oz (79.1 kg)    Physical Exam  Constitutional: Patient appears well-developed and well-nourished.  No distress.  HEENT: head atraumatic, normocephalic, pupils equal and reactive to light, neck supple Cardiovascular: Normal rate, regular rhythm and normal heart sounds.  No murmur heard. No BLE edema. Pulmonary/Chest: Effort normal and breath sounds normal. No respiratory distress. Abdominal: Soft.  There is no tenderness. Psychiatric: Patient has a normal mood and affect. behavior is normal. Judgment and thought content normal.   Results for orders placed or performed during the hospital encounter of 07/09/23  [facility] Molecular Screen- Blood ([facility])  Result Value Ref Range   Genetic Analysis Overall Interpretation Negative    Genetic Disease Assessed      [facility] Tier One Population Screen is a screening test that analyzes 11 genes related to hereditary breast and ovarian cancer (HBOC) syndrome, Lynch syndrome, and familial hypercholesterolemia. This test only reports clinically significant pathogenic and  likely pathogenic variants, unlike diagnostic testing, which also reports variants of uncertain significance (VUS). In addition, analysis of the PMS2 gene excludes exons 11-15, which overlap with a known pseudogene (PMS2CL).    Genetic Analysis Report      No pathogenic or likely pathogenic variants were detected in the genes analyzed by this test.Genetic test results should be interpreted in the context of an individual's personal medical and family history. Alteration to medical  management is NOT  recommended based solely on this result. Clinical correlation is advised.Additional Considerations- This is a screening test; individuals may still carry pathogenic or likely pathogenic variant(s) in the tested genes that are not detected by this test.-  For individuals at risk for these or other related conditions based on factors including personal or family history, diagnostic testing is recommended.- The absence of pathogenic or likely pathogenic variant(s) in the analyzed genes, while reassuring,  does not eliminate the possibility of a hereditary condition; there are other variants and genes associated with heart disease and hereditary cancer that are  not included in this test.    Genes Tested Reported    Disclaimer Reported    Sequencing Location Reported    Interpretation Methods and Limitations Reported       Assessment & Plan:   Problem List Items Addressed This Visit   None     Follow up plan: No follow-ups on file.

## 2023-10-03 ENCOUNTER — Telehealth (INDEPENDENT_AMBULATORY_CARE_PROVIDER_SITE_OTHER): Payer: BC Managed Care – PPO | Admitting: Psychiatry

## 2023-10-03 ENCOUNTER — Encounter: Payer: Self-pay | Admitting: Psychiatry

## 2023-10-03 DIAGNOSIS — F1590 Other stimulant use, unspecified, uncomplicated: Secondary | ICD-10-CM | POA: Diagnosis not present

## 2023-10-03 DIAGNOSIS — G4701 Insomnia due to medical condition: Secondary | ICD-10-CM | POA: Diagnosis not present

## 2023-10-03 DIAGNOSIS — F17200 Nicotine dependence, unspecified, uncomplicated: Secondary | ICD-10-CM

## 2023-10-03 DIAGNOSIS — F902 Attention-deficit hyperactivity disorder, combined type: Secondary | ICD-10-CM

## 2023-10-03 DIAGNOSIS — F411 Generalized anxiety disorder: Secondary | ICD-10-CM | POA: Diagnosis not present

## 2023-10-03 MED ORDER — ESCITALOPRAM OXALATE 5 MG PO TABS
5.0000 mg | ORAL_TABLET | Freq: Every day | ORAL | 1 refills | Status: DC
Start: 2023-10-03 — End: 2023-10-31

## 2023-10-03 NOTE — Progress Notes (Signed)
Virtual Visit via Video Note  I connected with Jeffery Gomez on 10/03/23 at  8:30 AM EST by a video enabled telemedicine application and verified that I am speaking with the correct person using two identifiers.  Location Provider Location : [facility] Patient Location : Work  Participants: Patient , Provider    I discussed the limitations of evaluation and management by telemedicine and the availability of in person appointments. The patient expressed understanding and agreed to proceed.   I discussed the assessment and treatment plan with the patient. The patient was provided an opportunity to ask questions and all were answered. The patient agreed with the plan and demonstrated an understanding of the instructions.   The patient was advised to call back or seek an in-person evaluation if the symptoms worsen or if the condition fails to improve as anticipated.    BH MD OP Progress Note  10/03/2023 9:00 AM Jeffery Gomez  MRN:  [id number]  Chief Complaint:  Chief Complaint  Patient presents with   Follow-up   ADD   Anxiety   Medication Refill   HPI: Jeffery Gomez is a 37 year old Caucasian male, lives in [place], employed, married, has a history of GAD, attention and focus deficit, caffeine use disorder, tobacco use disorder, obstructive sleep apnea noncompliant with treatment was evaluated by telemedicine today.  Patient's last visit was a week ago and at that time patient was taken off of the Vyvanse due to side effects and was started on low dosage Adderall extended release.  Patient reports he was able to pick up the Adderall extended release 2 days ago.  He took the first dosage yesterday morning.  Patient reports he took it pretty early at around 5:30 AM.  30 minutes after taking it he felt kind of excited euphoric for a few minutes.  Patient reports anxiety also worsened.  He however reports that it get better with time.  Sleep was kind of restless last night.   Patient reports his attention and focus seem to be better on the Adderall and he did not like the effect as time passed. Patient reports he would like to give the medication more time.  Patient continues to struggle with anxiety symptoms.  He has a lot of situational stressors including work.  He used to be on Lexapro previously and was noncompliant.  Patient felt anxiety got better when he started taking Vyvanse and hence stopped taking Lexapro previously.  Patient is agreeable to a trial of Lexapro at low dosage.  Patient with history of sleep apnea was prescribed a mouthpiece by his provider.  Patient has not been compliant with it.  Patient reports that mouthpiece is quite expensive.  He however agrees to reach out to his provider to discuss other options.  Patient currently denies any suicidality, homicidality or perceptual disturbances.  Patient denies any other concerns today.  Visit Diagnosis:    ICD-10-CM   1. Generalized anxiety disorder  F41.1 escitalopram (LEXAPRO) 5 MG tablet    2. Attention deficit hyperactivity disorder (ADHD), combined type  F90.2     3. Insomnia due to medical condition  G47.01    sleep apnea not compliant on CPAP    4. 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 Tobacco use disorder  F17.200       Past Psychiatric History: I have reviewed past psychiatric history from progress note on 04/24/2022.  Past trials of medications like BuSpar-side effect,  Depakote-side effect, Lexapro, Zyprexa, Vyvanse.  Past Medical History:  Past Medical History:  Diagnosis Date   Anxiety    Bipolar disorder (HCC)    Panic attacks     Past Surgical History:  Procedure Laterality Date   NO PAST SURGERIES      Family Psychiatric History: I have reviewed family psychiatric history from progress note on 04/24/2022.  Family History:  Family History  Problem Relation Age of Onset   Anxiety disorder Mother    Panic disorder Mother    Bipolar  disorder Father    Schizophrenia Father    Alcohol abuse Brother    Bipolar disorder Brother    ADD / ADHD Brother    Anxiety disorder Brother    Bipolar disorder Maternal Aunt    Alcohol abuse Maternal Grandfather    Cirrhosis Maternal Grandfather    Heart attack Maternal Grandmother    Bipolar disorder Paternal Grandmother    Thyroid disease Paternal Grandmother     Social History: I have reviewed social history from progress note on 04/24/2022. Social History   Socioeconomic History   Marital status: Married    Spouse name: Chelsie   Number of children: 3   Years of education: 11 TH GRADE   Highest education level: Not on file  Occupational History   Not on file  Tobacco Use   Smoking status: Some Days    Types: Cigarettes   Smokeless tobacco: Current    Types: Chew   Tobacco comments:    1 pack per week--03/20/2022  Vaping Use   Vaping status: Never Used  Substance and Sexual Activity   Alcohol use: Yes    Comment: occasinal   Drug use: No   Sexual activity: Yes    Partners: Female  Other Topics Concern   Not on file  Social History Narrative   Works at [employer] Strain: Low Risk  (03/31/2023)   Overall Financial Resource Strain (CARDIA)    Difficulty of Paying Living Expenses: Not hard at all  Food Insecurity: No Food Insecurity (03/31/2023)   Hunger Vital Sign    Worried About Running Out of Food in the Last Year: Never true    Ran Out of Food in the Last Year: Never true  Transportation Needs: No Transportation Needs (03/31/2023)   PRAPARE - Administrator, Civil Service (Medical): No    Lack of Transportation (Non-Medical): No  Physical Activity: Insufficiently Active (03/31/2023)   Exercise Vital Sign    Days of Exercise per Week: 3 days    Minutes of Exercise per Session: 30 min  Stress: No Stress Concern Present (03/31/2023)   [employer] of Occupational Health - Occupational Stress  Questionnaire    Feeling of Stress : Only a little  Social Connections: Moderately Integrated (03/31/2023)   Social Connection and Isolation Panel [NHANES]    Frequency of Communication with Friends and Family: More than three times a week    Frequency of Social Gatherings with Friends and Family: More than three times a week    Attends Religious Services: 1 to 4 times per year    Active Member of [employer] or Organizations: No    Attends Banker Meetings: Never    Marital Status: Married    Allergies:  Allergies  Allergen Reactions   Depakote [Divalproex Sodium] Anaphylaxis    Metabolic Disorder Labs: Lab Results  Component Value Date   HGBA1C 5.9 (H) 03/31/2023  MPG 123 03/31/2023   No results found for: "PROLACTIN" Lab Results  Component Value Date   CHOL 158 03/31/2023   TRIG 83 03/31/2023   HDL 40 03/31/2023   CHOLHDL 4.0 03/31/2023   LDLCALC 101 (H) 03/31/2023   Lab Results  Component Value Date   TSH 1.12 03/19/2022   TSH 1.03 12/30/2017    Therapeutic Level Labs: No results found for: "LITHIUM" No results found for: "VALPROATE" No results found for: "CBMZ"  Current Medications: Current Outpatient Medications  Medication Sig Dispense Refill   escitalopram (LEXAPRO) 5 MG tablet Take 1 tablet (5 mg total) by mouth daily. 30 tablet 1   amphetamine-dextroamphetamine (ADDERALL XR) 20 MG 24 hr capsule Take 1 capsule (20 mg total) by mouth in the morning. Stop Vyvanse 30 capsule 0   Azelastine HCl 137 MCG/SPRAY SOLN SMARTSIG:1-2 Spray(s) Both Nares Every 12 Hours PRN     clobetasol (TEMOVATE) 0.05 % external solution SMARTSIG:Sparingly Topical Twice Daily     fluticasone (FLONASE) 50 MCG/ACT nasal spray Place 2 sprays into both nostrils daily. 16 g 6   propranolol (INDERAL) 20 MG tablet TAKE 1 TABLET (20 MG TOTAL) BY MOUTH 2 (TWO) TIMES DAILY AS NEEDED. FOR ANXIETY 180 tablet 1   No current facility-administered medications for this visit.      Musculoskeletal: Strength & Muscle Tone:  UTA Gait & Station:  Seated Patient leans: N/A  Psychiatric Specialty Exam: Review of Systems  Psychiatric/Behavioral:  Positive for decreased concentration and sleep disturbance. The patient is nervous/anxious.     There were no vitals taken for this visit.There is no height or weight on file to calculate BMI.  General Appearance: Casual  Eye Contact:  Fair  Speech:  Clear and Coherent  Volume:  Normal  Mood:  Anxious  Affect:  Congruent  Thought Process:  Goal Directed and Descriptions of Associations: Intact  Orientation:  Full (Time, Place, and Person)  Thought Content: Logical   Suicidal Thoughts:  No  Homicidal Thoughts:  No  Memory:  Immediate;   Fair Recent;   Fair Remote;   Fair  Judgement:  Fair  Insight:  Fair  Psychomotor Activity:  Normal  Concentration:  Concentration: Fair and Attention Span: Fair  Recall:  [employer] of Knowledge: Fair  Language: Fair  Akathisia:  No  Handed:  Right  AIMS (if indicated): not done  Assets:  Desire for Improvement Housing Social Support Transportation  ADL's:  Intact  Cognition: WNL  Sleep:  Poor   Screenings: GAD-7    Flowsheet Row Video Visit from 09/26/2023 in [facility] Office Visit from 08/26/2023 in [facility] Office Visit from 04/22/2023 in [facility] Office Visit from 03/31/2023 in [facility] Video Visit from 12/26/2022 in [facility]  Total GAD-7 Score 17 7 5  0 14      PHQ2-9    Flowsheet Row Office Visit from 08/26/2023 in [facility] Office Visit from 04/22/2023 in [facility] Office Visit from 03/31/2023 in [facility] Video Visit from 11/13/2022 in [facility] Office Visit from 09/19/2022 in [facility]  PHQ-2 Total Score 2 2 0 1 2  PHQ-9 Total Score 7 5 0 -- 10      Flowsheet Row Office Visit from 08/26/2023 in [facility] Office Visit from 04/22/2023 in [facility] Video Visit from 03/03/2023 in [facility]  C-SSRS RISK CATEGORY No Risk No Risk No Risk        Assessment and Plan: Jeffery Gomez is a 37 year old Caucasian male, returns for a follow-up appointment, recent initiation of Adderall extended release, patient currently with continued anxiety symptoms, sleep issues concentration problems, discussed plan as noted below.  Plan  GAD-unstable Restart Lexapro 5 mg p.o. daily.  Patient to start this medication 10 days from now since he just started the Adderall XR. Continue propranolol 20 mg p.o. twice daily as needed  ADHD-combined type-unstable Continue Adderall extended release 20 mg p.o. daily in the morning Reviewed [place] PMP AWARxE UDS-03/20/2023-negative Patient advised to give the Adderall more time since he just started it yesterday.  If he continues to have anxiety or sleep issues from the Adderall patient to reach out to this provider for further medication management.  Insomnia-multifactorial including uncorrected sleep apnea-unstable Patient provided education.  Patient agrees to reach out to his provider to discuss CPAP device. Patient also advised to start taking melatonin over-the-counter to help with sleep.  Patient to work on sleep hygiene.  Caffeine use disorder-in partial remission Patient is currently cutting back.  Tobacco use disorder-improving Will monitor closely  Follow-up in clinic in 3 weeks or sooner if needed. Collaboration of Care: Collaboration of Care: Other patient encouraged to reach out to pulmonologist/sleep provider to  discuss CPAP  Patient/Guardian was advised Release of Information must be obtained prior to any record release in order to collaborate their care with an outside provider. Patient/Guardian was advised if they have not already done so to contact the registration department to sign all necessary forms in order for [place] to release information regarding their care.   Consent: Patient/Guardian gives verbal consent for treatment and assignment of benefits for services provided during this visit. Patient/Guardian expressed understanding and agreed to proceed.   This note was generated in part or whole with voice recognition software. Voice recognition is usually quite accurate but there are transcription errors that can and very often do occur. I apologize for any typographical errors that were not detected and corrected.    Jomarie Longs, MD 10/03/2023, 9:00 AM

## 2023-10-13 ENCOUNTER — Ambulatory Visit (INDEPENDENT_AMBULATORY_CARE_PROVIDER_SITE_OTHER): Payer: BC Managed Care – PPO | Admitting: Licensed Clinical Social Worker

## 2023-10-13 DIAGNOSIS — F411 Generalized anxiety disorder: Secondary | ICD-10-CM | POA: Diagnosis not present

## 2023-10-13 DIAGNOSIS — F902 Attention-deficit hyperactivity disorder, combined type: Secondary | ICD-10-CM
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
## 2023-10-24 ENCOUNTER — Encounter: Payer: Self-pay | Admitting: Psychiatry

## 2023-10-24 ENCOUNTER — Telehealth (INDEPENDENT_AMBULATORY_CARE_PROVIDER_SITE_OTHER): Payer: BC Managed Care – PPO | Admitting: Psychiatry

## 2023-10-24 DIAGNOSIS — F411 Generalized anxiety disorder: Secondary | ICD-10-CM | POA: Diagnosis not present

## 2023-10-24 DIAGNOSIS — F902 Attention-deficit hyperactivity disorder, combined type: Secondary | ICD-10-CM

## 2023-10-24 DIAGNOSIS — F1590 Other stimulant use, unspecified, uncomplicated: Secondary | ICD-10-CM | POA: Diagnosis not present

## 2023-10-24 DIAGNOSIS — G4701 Insomnia due to medical condition: Secondary | ICD-10-CM

## 2023-10-24 DIAGNOSIS — F17200 Nicotine dependence, unspecified, uncomplicated: Secondary | ICD-10-CM

## 2023-10-24 MED ORDER — AMPHETAMINE-DEXTROAMPHET ER 20 MG PO CP24
20.0000 mg | ORAL_CAPSULE | Freq: Every morning | ORAL | 0 refills | Status: DC
Start: 2023-10-30 — End: 2023-11-17

## 2023-10-24 NOTE — Progress Notes (Signed)
Virtual Visit via Video Note  I connected with Jeffery Gomez on 10/24/23 at  9:30 AM EST by a video enabled telemedicine application and verified that I am speaking with the correct person using two identifiers.  Location Provider Location : [facility] Patient Location : Car  Participants: Patient , Provider    I discussed the limitations of evaluation and management by telemedicine and the availability of in person appointments. The patient expressed understanding and agreed to proceed.   I discussed the assessment and treatment plan with the patient. The patient was provided an opportunity to ask questions and all were answered. The patient agreed with the plan and demonstrated an understanding of the instructions.   The patient was advised to call back or seek an in-person evaluation if the symptoms worsen or if the condition fails to improve as anticipated.    BH MD OP Progress Note  10/24/2023 1:27 PM Jeffery Gomez  MRN:  [id number]  Chief Complaint:  Chief Complaint  Patient presents with   Follow-up   ADD   Anxiety   Medication Refill   HPI: Jeffery Gomez is a 37 year old Caucasian male, lives in [place], employed, married, has a history of GAD, ADHD, insomnia, caffeine use disorder, tobacco use disorder, obstructive sleep apnea noncompliant with treatment was evaluated by telemedicine today.  The patient, with a history of generalized anxiety disorder and ADHD combined type, was recently started on Lexapro and Adderall extended release 20 mg. He also began using a mouthpiece for sleep apnea and taking over-the-counter melatonin. The patient reported initial jitters with the Adderall, but these seem to have subsided. He also noted improved sleep since starting the mouthpiece and melatonin. However, he still feels a bit down and has noticed weight loss, which he attributes to the appetite-suppressing effects of Adderall. He reported difficulty eating, especially in  the morning, and often feels like gagging when trying to force himself to eat. His appetite seems to improve slightly in the evening.  The patient also reported that his focus is still a bit scattered due to multiple stressors at work and home. He has been taking the Lexapro and melatonin at night, about an hour before bed, and the Adderall in the morning. He has been compliant with this regimen for about four to five days.  The patient also mentioned that he is sensitive to medications and that his body tends to acclimate quickly to new drugs. He continues to use chewing tobacco and has not made any recent attempts to quit.  He denies any suicidality, homicidality or perceptual disturbances.  Patient denies any other concerns today.  Visit Diagnosis:    ICD-10-CM   1. Generalized anxiety disorder  F41.1     2. Attention deficit hyperactivity disorder (ADHD), combined type  F90.2 amphetamine-dextroamphetamine (ADDERALL XR) 20 MG 24 hr capsule    3. Insomnia due to medical condition  G47.01    Sleep apnea not compliant on CPAP    4. Caffeine use disorder  F15.90    In partial remission, mild    5. Tobacco use disorder  F17.200       Past Psychiatric History: I have reviewed past psychiatric history from progress note on 04/24/2022.  Past trials of medications like BuSpar-side effect, Depakote-side effect, Lexapro, Zyprexa, Vyvanse.  Past Medical History:  Past Medical History:  Diagnosis Date   Anxiety    Bipolar disorder (HCC)    Panic attacks     Past Surgical History:  Procedure Laterality  Date   NO PAST SURGERIES      Family Psychiatric History: I have reviewed family psychiatric history from progress note on 04/24/2022.  Family History:  Family History  Problem Relation Age of Onset   Anxiety disorder Mother    Panic disorder Mother    Bipolar disorder Father    Schizophrenia Father    Alcohol abuse Brother    Bipolar disorder Brother    ADD / ADHD Brother     Anxiety disorder Brother    Bipolar disorder Maternal Aunt    Alcohol abuse Maternal Grandfather    Cirrhosis Maternal Grandfather    Heart attack Maternal Grandmother    Bipolar disorder Paternal Grandmother    Thyroid disease Paternal Grandmother     Social History: I have reviewed social history from progress note on 04/24/2022. Social History   Socioeconomic History   Marital status: Married    Spouse name: Chelsie   Number of children: 3   Years of education: 11 TH GRADE   Highest education level: Not on file  Occupational History   Not on file  Tobacco Use   Smoking status: Some Days    Types: Cigarettes   Smokeless tobacco: Current    Types: Chew   Tobacco comments:    1 pack per week--03/20/2022  Vaping Use   Vaping status: Never Used  Substance and Sexual Activity   Alcohol use: Yes    Comment: occasinal   Drug use: No   Sexual activity: Yes    Partners: Female  Other Topics Concern   Not on file  Social History Narrative   Works at [employer] Strain: Low Risk  (03/31/2023)   Overall Financial Resource Strain (CARDIA)    Difficulty of Paying Living Expenses: Not hard at all  Food Insecurity: No Food Insecurity (03/31/2023)   Hunger Vital Sign    Worried About Running Out of Food in the Last Year: Never true    Ran Out of Food in the Last Year: Never true  Transportation Needs: No Transportation Needs (03/31/2023)   PRAPARE - Administrator, Civil Service (Medical): No    Lack of Transportation (Non-Medical): No  Physical Activity: Insufficiently Active (03/31/2023)   Exercise Vital Sign    Days of Exercise per Week: 3 days    Minutes of Exercise per Session: 30 min  Stress: No Stress Concern Present (03/31/2023)   [employer] of Occupational Health - Occupational Stress Questionnaire    Feeling of Stress : Only a little  Social Connections: Moderately Integrated (03/31/2023)   Social  Connection and Isolation Panel [NHANES]    Frequency of Communication with Friends and Family: More than three times a week    Frequency of Social Gatherings with Friends and Family: More than three times a week    Attends Religious Services: 1 to 4 times per year    Active Member of [employer] or Organizations: No    Attends Banker Meetings: Never    Marital Status: Married    Allergies:  Allergies  Allergen Reactions   Depakote [Divalproex Sodium] Anaphylaxis    Metabolic Disorder Labs: Lab Results  Component Value Date   HGBA1C 5.9 (H) 03/31/2023   MPG 123 03/31/2023   No results found for: "PROLACTIN" Lab Results  Component Value Date   CHOL 158 03/31/2023   TRIG 83 03/31/2023   HDL 40 03/31/2023   CHOLHDL 4.0 03/31/2023  LDLCALC 101 (H) 03/31/2023   Lab Results  Component Value Date   TSH 1.12 03/19/2022   TSH 1.03 12/30/2017    Therapeutic Level Labs: No results found for: "LITHIUM" No results found for: "VALPROATE" No results found for: "CBMZ"  Current Medications: Current Outpatient Medications  Medication Sig Dispense Refill   [START ON 10/30/2023] amphetamine-dextroamphetamine (ADDERALL XR) 20 MG 24 hr capsule Take 1 capsule (20 mg total) by mouth in the morning. Stop Vyvanse 30 capsule 0   Azelastine HCl 137 MCG/SPRAY SOLN SMARTSIG:1-2 Spray(s) Both Nares Every 12 Hours PRN     clobetasol (TEMOVATE) 0.05 % external solution SMARTSIG:Sparingly Topical Twice Daily     escitalopram (LEXAPRO) 5 MG tablet Take 1 tablet (5 mg total) by mouth daily. 30 tablet 1   fluticasone (FLONASE) 50 MCG/ACT nasal spray Place 2 sprays into both nostrils daily. 16 g 6   propranolol (INDERAL) 20 MG tablet TAKE 1 TABLET (20 MG TOTAL) BY MOUTH 2 (TWO) TIMES DAILY AS NEEDED. FOR ANXIETY 180 tablet 1   No current facility-administered medications for this visit.     Musculoskeletal: Strength & Muscle Tone:  UTA Gait & Station:  Seated Patient leans:  N/A  Psychiatric Specialty Exam: Review of Systems  Psychiatric/Behavioral:  Positive for sleep disturbance. The patient is nervous/anxious.     There were no vitals taken for this visit.There is no height or weight on file to calculate BMI.  General Appearance: Fairly Groomed  Eye Contact:  Fair  Speech:  Clear and Coherent  Volume:  Normal  Mood:  Anxious  Affect:  Appropriate  Thought Process:  Goal Directed and Descriptions of Associations: Intact  Orientation:  Full (Time, Place, and Person)  Thought Content: Logical   Suicidal Thoughts:  No  Homicidal Thoughts:  No  Memory:  Immediate;   Fair Recent;   Fair Remote;   Fair  Judgement:  Fair  Insight:  Fair  Psychomotor Activity:  Normal  Concentration:  Concentration: Fair and Attention Span: Fair  Recall:  [employer] of Knowledge: Fair  Language: Fair  Akathisia:  No  Handed:  Right  AIMS (if indicated): not done  Assets:  Communication Skills Desire for Improvement Housing Social Support  ADL's:  Intact  Cognition: WNL  Sleep:   improving   Screenings: GAD-7    Advertising copywriter from 10/13/2023 in [facility] Video Visit from 09/26/2023 in [facility] Office Visit from 08/26/2023 in [facility] Office Visit from 04/22/2023 in [facility] Office Visit from 03/31/2023 in [facility]  Total GAD-7 Score 15 17 7 5  0      PHQ2-9    Flowsheet Row Counselor from 10/13/2023 in [facility] Office Visit from 08/26/2023 in [facility] Office Visit from 04/22/2023 in [facility] Office Visit from 03/31/2023 in [facility] Video Visit from 11/13/2022 in [facility]  PHQ-2 Total Score 2 2 2  0 1  PHQ-9 Total Score 12 7 5  0 --      Flowsheet Row Counselor from 10/13/2023 in [facility] Office Visit from 08/26/2023 in [facility] Office Visit from 04/22/2023 in [facility]  C-SSRS RISK CATEGORY No Risk No Risk No Risk  Assessment and Plan: Jeffery Gomez is a 37 year old Caucasian male, presents for follow-up appointment after recent medication changes and reports positive response to recent changes as noted below in assessment and plan.  Generalized Anxiety Disorder -improving Generalized anxiety disorder, currently on Lexapro with some improvement but still experiencing low mood, possibly seasonal. Compliant with Lexapro and melatonin. - Continue Lexapro 5 mg p.o. daily - Continue melatonin 10 mg daily -Continue propranolol 20 mg p.o. twice daily as needed for anxiety - Re-evaluate in 8 weeks  ADHD, Combined Type-improving ADHD managed with Adderall extended release 20 mg. Initial jitters reported, compliant with medication. Experiencing appetite suppression, weight loss, and difficulty eating. Advised on managing appetite with small, nutrient-dense snacks or protein shakes. - Continue Adderall extended release 20 mg - [facility] prescription [facility] - Reviewed  PMP AWARxE - Encourage small, nutrient-dense snacks or protein shakes throughout the day - Re-evaluate in 8 weeks  Sleep Apnea/insomnia-unstable Sleep apnea managed with a mouthpiece for snoring and jaw movement. Reports improved sleep quality. Emphasized continued use and monitoring. - Continue using the mouthpiece for sleep apnea  Tobacco Use-unstable Continues to chew tobacco without significant efforts to quit. Discussed health risks and offered support and resources for cessation. - Encourage reduction or cessation of  tobacco use, provided counseling for 1 minute. - Offer support and resources for quitting  Follow-up - Schedule follow-up appointment for February 4th at 3:30 PM.    Consent: Patient/Guardian gives verbal consent for treatment and assignment of benefits for services provided during this visit. Patient/Guardian expressed understanding and agreed to proceed.   This note was generated in part or whole with voice recognition software. Voice recognition is usually quite accurate but there are transcription errors that can and very often do occur. I apologize for any typographical errors that were not detected and corrected.    Jomarie Longs, MD 10/24/2023, 1:27 PM

## 2023-10-29 ENCOUNTER — Other Ambulatory Visit: Payer: Self-pay | Admitting: Psychiatry

## 2023-10-29 DIAGNOSIS — F411 Generalized anxiety disorder: Secondary | ICD-10-CM

## 2023-11-10 ENCOUNTER — Telehealth: Payer: Self-pay | Admitting: Psychiatry

## 2023-11-10 NOTE — Telephone Encounter (Signed)
Patient sent mychart message but also called with concerns of taking his medications. States Lexapro was added. He is feeling a lot of anxiety, paranoia, not wanting to get out bed, depression, and extreme brain fog. Not sure if it is the stimulants which is only thing different added. Please advise.

## 2023-11-10 NOTE — Telephone Encounter (Signed)
Patient stated that he stopped the adderall on last week and slowly seeing changes paranoia has improved and wishing to be dead is no longer a thought he will give the office a call in  a few days to let us know how he is feeling but if things get worst he was advise to get help by going to the ER or Urgent care he voiced understanding

## 2023-11-14 NOTE — Telephone Encounter (Signed)
I spoke to this patient on yesterday and he stated that he was feeling better but wanted me to hold off on sending a message until Dr Elna Breslow returned to the office because he didn't want to go to the hospital and be admitted and be kept for days and it may have an effect on his job so I agreed to keep the message and not send it until she returns

## 2023-11-17 ENCOUNTER — Ambulatory Visit: Payer: BC Managed Care – PPO | Admitting: Psychiatry

## 2023-11-17 ENCOUNTER — Encounter: Payer: Self-pay | Admitting: Psychiatry

## 2023-11-17 VITALS — BP 111/77 | HR 77 | Temp 98.4°F | Ht 69.0 in | Wt 159.6 lb

## 2023-11-17 DIAGNOSIS — F411 Generalized anxiety disorder: Secondary | ICD-10-CM

## 2023-11-17 DIAGNOSIS — F0630 Mood disorder due to known physiological condition, unspecified: Secondary | ICD-10-CM | POA: Diagnosis not present

## 2023-11-17 DIAGNOSIS — T887XXA Unspecified adverse effect of drug or medicament, initial encounter: Secondary | ICD-10-CM

## 2023-11-17 DIAGNOSIS — F17200 Nicotine dependence, unspecified, uncomplicated: Secondary | ICD-10-CM

## 2023-11-17 DIAGNOSIS — F1590 Other stimulant use, unspecified, uncomplicated: Secondary | ICD-10-CM

## 2023-11-17 DIAGNOSIS — F902 Attention-deficit hyperactivity disorder, combined type: Secondary | ICD-10-CM

## 2023-11-17 DIAGNOSIS — Z9189 Other specified personal risk factors, not elsewhere classified: Secondary | ICD-10-CM

## 2023-11-17 DIAGNOSIS — G4701 Insomnia due to medical condition: Secondary | ICD-10-CM

## 2023-11-17 MED ORDER — RISPERIDONE 0.25 MG PO TABS
0.2500 mg | ORAL_TABLET | Freq: Every day | ORAL | 1 refills | Status: DC
Start: 2023-11-17 — End: 2023-12-09

## 2023-11-17 NOTE — Patient Instructions (Addendum)
Please call for EKG - [phone]   Risperidone Tablets What is this medication? RISPERIDONE (ris PER i done) treats schizophrenia, bipolar disorder, and autism spectrum disorder. It works by balancing the levels of dopamine and serotonin in your brain, substances that help regulate mood, behaviors, and thoughts. It belongs to a group of medications called antipsychotics. Antipsychotic medications can be used to treat several kinds of mental health conditions. This medicine may be used for other purposes; ask your health care provider or pharmacist if you have questions. COMMON BRAND NAME(S): Risperdal What should I tell my care team before I take this medication? They need to know if you have any of these conditions: Dementia Diabetes Difficulty swallowing Have trouble controlling your muscles Heart disease High cholesterol High levels of prolactin History of breast cancer History of irregular heartbeat History of stroke Kidney disease Liver disease Low blood counts, like low white cell, platelet, or red cell counts Low blood pressure Parkinson's disease Seizures An unusual or allergic reaction to risperidone, paliperidone, other medications, foods, dyes, or preservatives Pregnant or trying to get pregnant Breast-feeding How should I use this medication? Take this medication by mouth with water. Take it as directed on the prescription label at the same time every day. You can take it with or without food. If it upsets your stomach, take it with food. Keep taking it unless your care team tells you to stop. Talk to your care team about the use of this medication in children. While this medication may be prescribed for children as young as 60 years of age for selected conditions, precautions do apply. Overdosage: If you think you have taken too much of this medicine contact a poison control center or emergency room at once. NOTE: This medicine is only for you. Do not share this  medicine with others. What if I miss a dose? If you miss a dose, take it as soon as you can. If it is almost time for your next dose, take only that dose. Do not take double or extra doses. What may interact with this medication? Do not take this medication with any of the following: Cisapride Dextromethorphan; quinidine Dronedarone Metoclopramide Pimozide Quinidine Thioridazine This medication may also interact with the following: Alcohol Antihistamines for allergy, cough, and cold Certain medications for anxiety or sleep Certain medications for depression like amitriptyline, fluoxetine, paroxetine, sertraline General anesthetics like halothane, isoflurane, methoxyflurane, propofol Levodopa or other medications for Parkinson's disease Medications for blood pressure Medications for seizures Medications that relax muscles for surgery Methylphenidate Narcotic medications for pain Other medications that prolong the QT interval (cause an abnormal heart rhythm) Phenothiazines like chlorpromazine, prochlorperazine Rifampin This list may not describe all possible interactions. Give your health care provider a list of all the medicines, herbs, non-prescription drugs, or dietary supplements you use. Also tell them if you smoke, drink alcohol, or use illegal drugs. Some items may interact with your medicine. What should I watch for while using this medication? Visit your care team for regular checks on your progress. Tell your care team if symptoms do not start to get better or if they get worse. Do not stop taking except on your care team's advice. You may develop a severe reaction. Your care team will tell you how much medication to take. You may get dizzy or drowsy. Do not drive, use machinery, or do anything that needs mental alertness until you know how this medication affects you. Do not stand or sit up quickly, especially if you  are an older patient. This reduces the risk of dizzy or  fainting spells. Alcohol may interfere with the effect of this medication. Avoid alcoholic drinks. This medication can cause problems with controlling your body temperature. It can lower the response of your body to cold temperatures. If possible, stay indoors during cold weather. If you must go outdoors, wear warm clothes. It can also lower the response of your body to heat. Do not overheat. Do not over-exercise. Stay out of the sun when possible. If you must be in the sun, wear cool clothing. Drink plenty of water. If you have trouble controlling your body temperature, call your care team right away. This medication may increase blood sugar. Ask your care team if changes in diet or medications are needed if you have diabetes. What side effects may I notice from receiving this medication? Side effects that you should report to your care team as soon as possible: Allergic reactions--skin rash, itching, hives, swelling of the face, lips, tongue, or throat High blood sugar (hyperglycemia)--increased thirst or amount of urine, unusual weakness or fatigue, blurry vision High fever, stiff muscles, increased sweating, fast or irregular heartbeat, and confusion, which may be signs of neuroleptic malignant syndrome High prolactin level--unexpected breast tissue growth, discharge from the nipple, change in sex drive or performance, irregular menstrual cycle Infection--fever, chills, cough, or sore throat Low blood pressure--dizziness, feeling faint or lightheaded, blurry vision Pain or trouble swallowing Prolonged or painful erection Seizures Stroke--sudden numbness or weakness of the face, arm, or leg, trouble speaking, confusion, trouble walking, loss of balance or coordination, dizziness, severe headache, change in vision Thoughts of suicide or self-harm, worsening mood, feelings of depression Uncontrolled and repetitive body movements, muscle stiffness or spasms, tremors or shaking, loss of balance or  coordination, restlessness, shuffling walk, which may be signs of extrapyramidal symptoms (EPS) Side effects that usually do not require medical attention (report to your care team if they continue or are bothersome): Constipation Dizziness Drowsiness Dry mouth Runny or stuffy nose Upset stomach Weight gain This list may not describe all possible side effects. Call your doctor for medical advice about side effects. You may report side effects to FDA at 1-800-FDA-1088. Where should I keep my medication? Keep out of the reach of children and pets. Store at room temperature between 15 and 25 degrees C (59 and 77 degrees F). Protect from light. Get rid of any unused medication after the expiration date. To get rid of medications that are no longer needed or have expired: Take the medication to a medication take-back program. Check with your pharmacy or law enforcement to find a location. If you cannot return the medication, check the label or package insert to see if the medication should be thrown out in the garbage or flushed down the toilet. If you are not sure, ask your care team. If it is safe to put it in the trash, take the medication out of the container. Mix the medication with cat litter, dirt, coffee grounds, or other unwanted substance. Seal the mixture in a bag or container. Put it in the trash. NOTE: This sheet is a summary. It may not cover all possible information. If you have questions about this medicine, talk to your doctor, pharmacist, or health care provider.  2024 Elsevier/Gold Standard (2021-10-05 00:00:00)

## 2023-12-02 ENCOUNTER — Encounter: Payer: Self-pay | Admitting: Psychiatry

## 2023-12-02 ENCOUNTER — Ambulatory Visit: Payer: BC Managed Care – PPO | Admitting: Psychiatry

## 2023-12-02 VITALS — BP 112/78 | HR 77 | Temp 98.7°F | Ht 69.0 in | Wt 153.8 lb

## 2023-12-02 DIAGNOSIS — F902 Attention-deficit hyperactivity disorder, combined type: Secondary | ICD-10-CM | POA: Diagnosis not present

## 2023-12-02 DIAGNOSIS — F0630 Mood disorder due to known physiological condition, unspecified: Secondary | ICD-10-CM | POA: Diagnosis not present

## 2023-12-02 DIAGNOSIS — F411 Generalized anxiety disorder: Secondary | ICD-10-CM

## 2023-12-02 DIAGNOSIS — F1590 Other stimulant use, unspecified, uncomplicated: Secondary | ICD-10-CM

## 2023-12-02 DIAGNOSIS — T50905D Adverse effect of unspecified drugs, medicaments and biological substances, subsequent encounter: Secondary | ICD-10-CM

## 2023-12-02 DIAGNOSIS — G4701 Insomnia due to medical condition: Secondary | ICD-10-CM | POA: Diagnosis not present

## 2023-12-02 DIAGNOSIS — Z9189 Other specified personal risk factors, not elsewhere classified: Secondary | ICD-10-CM

## 2023-12-02 DIAGNOSIS — F17200 Nicotine dependence, unspecified, uncomplicated: Secondary | ICD-10-CM

## 2023-12-02 DIAGNOSIS — Z79899 Other long term (current) drug therapy: Secondary | ICD-10-CM

## 2023-12-02 MED ORDER — ATOMOXETINE HCL 18 MG PO CAPS
18.0000 mg | ORAL_CAPSULE | Freq: Every day | ORAL | 1 refills | Status: DC
Start: 2023-12-02 — End: 2023-12-26

## 2023-12-02 NOTE — Progress Notes (Signed)
 BH MD OP Progress Note  12/02/2023 8:58 AM Jeffery Gomez  MRN:  [id number]  Chief Complaint:  Chief Complaint  Patient presents with   Follow-up   Anxiety   ADD   Medication Refill   Mood swings   HPI: Jeffery Gomez is a 38 year old Caucasian male, lives in [place], employed, married, has a history of GAD, ADHD, insomnia, caffeine use disorder, obstructive sleep apnea noncompliant with treatment was evaluated in office today.  The patient presented for a follow-up consultation two weeks after experiencing adverse reactions to a stimulant medication. The patient reported significant improvement since the last visit, attributing the positive change to Risperdal , which seemed to alleviate the previously experienced anxiety and nervousness. However, the patient noted faint muscle spasms, predominantly occurring before bedtime, and a slight difficulty in focusing and recalling simple information, such as store names. Despite these symptoms, the patient reported no significant impact on daily functioning.  The patient also reported a decrease in energy levels, which he managed by consuming energy drinks or coffee. However, the patient noted that caffeine no longer had the same strong effect as before. The patient also reported an increase in appetite, which he considered normal, and was now eating three meals a day.  The patient expressed concerns about his ADHD not being treated, which could be contributing to the reported 'brain fog.' The patient with sleep apnea was using a self-purchased mouthpiece to manage snoring, as the recommended one was too expensive. The patient was considering increasing the dosage of his current medication or switching to a non-stimulant medication due to fear of experiencing paranoia and suicidal thoughts, which were previously associated with stimulant medication use.  The patient reported no thoughts of self-harm , denies any homicidality or perceptual  disturbances.   The patient is compliant with his current medication regimen, including Lexapro .   Patient denies any other concerns today.  Visit Diagnosis:    ICD-10-CM   1. Generalized anxiety disorder  F41.1     2. Attention deficit hyperactivity disorder (ADHD), combined type  F90.2 atomoxetine  (STRATTERA ) 18 MG capsule    3. Mood disorder in conditions classified elsewhere  F06.30    Rule out bipolar disorder versus mood disorder in response to stimulant medication    4. Insomnia due to medical condition  G47.01    Obstructive sleep apnea noncompliant with CPAP    5. 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 Tobacco use disorder  F17.200     7. At risk for prolonged QT interval syndrome  Z91.89 EKG 12-Lead    8. High risk medication use  Z79.899 Prolactin    9. Adverse effect of drug, subsequent encounter  T50.905D    Adderall      Past Psychiatric History: I have reviewed past psychiatric history from progress note on 04/24/2022.  Past trials of medications like BuSpar -side effect, Depakote-side effect, Lexapro , Zyprexa, Vyvanse , Adderall-psychosis/suicidality.  Past Medical History:  Past Medical History:  Diagnosis Date   Anxiety    Bipolar disorder (HCC)    Panic attacks     Past Surgical History:  Procedure Laterality Date   NO PAST SURGERIES      Family Psychiatric History: I have reviewed family psychiatric history from progress note on 04/24/2022.  Family History:  Family History  Problem Relation Age of Onset   Anxiety disorder Mother    Panic disorder Mother    Bipolar disorder Father    Schizophrenia Father    Alcohol abuse  Brother    Bipolar disorder Brother    ADD / ADHD Brother    Anxiety disorder Brother    Bipolar disorder Maternal Aunt    Alcohol abuse Maternal Grandfather    Cirrhosis Maternal Grandfather    Heart attack Maternal Grandmother    Bipolar disorder Paternal Grandmother    Thyroid disease Paternal Grandmother     Social  History: I have reviewed social history from progress note on 04/24/2022. Social History   Socioeconomic History   Marital status: Married    Spouse name: Jeffery Gomez   Number of children: 3   Years of education: 11 TH GRADE   Highest education level: Not on file  Occupational History   Not on file  Tobacco Use   Smoking status: Former    Types: Cigarettes   Smokeless tobacco: Current    Types: Chew   Tobacco comments:    1 pack per week--03/20/2022  Vaping Use   Vaping status: Never Used  Substance and Sexual Activity   Alcohol use: Yes    Comment: occasinal   Drug use: No   Sexual activity: Yes    Partners: Female  Other Topics Concern   Not on file  Social History Narrative   Works at [employer]   Social Drivers of [employer] Strain: Low Risk  (03/31/2023)   Overall Financial Resource Strain (CARDIA)    Difficulty of Paying Living Expenses: Not hard at all  Food Insecurity: No Food Insecurity (03/31/2023)   Hunger Vital Sign    Worried About Running Out of Food in the Last Year: Never true    Ran Out of Food in the Last Year: Never true  Transportation Needs: No Transportation Needs (03/31/2023)   PRAPARE - Administrator, Civil Service (Medical): No    Lack of Transportation (Non-Medical): No  Physical Activity: Insufficiently Active (03/31/2023)   Exercise Vital Sign    Days of Exercise per Week: 3 days    Minutes of Exercise per Session: 30 min  Stress: No Stress Concern Present (03/31/2023)   [employer] of Occupational Health - Occupational Stress Questionnaire    Feeling of Stress : Only a little  Social Connections: Moderately Integrated (03/31/2023)   Social Connection and Isolation Panel [NHANES]    Frequency of Communication with Friends and Family: More than three times a week    Frequency of Social Gatherings with Friends and Family: More than three times a week    Attends Religious Services: 1 to 4 times per year    Active Member  of [employer] or Organizations: No    Attends Banker Meetings: Never    Marital Status: Married    Allergies:  Allergies  Allergen Reactions   Adderall [Amphetamine -Dextroamphetamine] Other (See Comments)    Crying , paranoia , suicidality   Depakote [Divalproex Sodium] Anaphylaxis    Metabolic Disorder Labs: Lab Results  Component Value Date   HGBA1C 5.9 (H) 03/31/2023   MPG 123 03/31/2023   No results found for: PROLACTIN Lab Results  Component Value Date   CHOL 158 03/31/2023   TRIG 83 03/31/2023   HDL 40 03/31/2023   CHOLHDL 4.0 03/31/2023   LDLCALC 101 (H) 03/31/2023   Lab Results  Component Value Date   TSH 1.12 03/19/2022   TSH 1.03 12/30/2017    Therapeutic Level Labs: No results found for: LITHIUM No results found for: VALPROATE No results found for: CBMZ  Current Medications: Current Outpatient Medications  Medication Sig Dispense Refill   atomoxetine  (STRATTERA ) 18 MG capsule Take 1 capsule (18 mg total) by mouth daily. 30 capsule 1   Azelastine HCl 137 MCG/SPRAY SOLN      clobetasol (TEMOVATE) 0.05 % external solution SMARTSIG:Sparingly Topical Twice Daily     escitalopram  (LEXAPRO ) 5 MG tablet TAKE 1 TABLET (5 MG TOTAL) BY MOUTH DAILY. 90 tablet 1   fluticasone  (FLONASE ) 50 MCG/ACT nasal spray Place 2 sprays into both nostrils daily. 16 g 6   propranolol  (INDERAL ) 20 MG tablet TAKE 1 TABLET (20 MG TOTAL) BY MOUTH 2 (TWO) TIMES DAILY AS NEEDED. FOR ANXIETY 180 tablet 1   risperiDONE  (RISPERDAL ) 0.25 MG tablet Take 1 tablet (0.25 mg total) by mouth at bedtime. 30 tablet 1   No current facility-administered medications for this visit.     Musculoskeletal: Strength & Muscle Tone: within normal limits Gait & Station: normal Patient leans: N/A  Psychiatric Specialty Exam: Review of Systems  Psychiatric/Behavioral:  Positive for decreased concentration. The patient is nervous/anxious.     Blood pressure 112/78, pulse 77,  temperature 98.7 F (37.1 C), temperature source Temporal, height 5' 9 (1.753 m), weight 153 lb 12.8 oz (69.8 kg), SpO2 99%.Body mass index is 22.71 kg/m.  General Appearance: Fairly Groomed  Eye Contact:  Fair  Speech:  Clear and Coherent  Volume:  Normal  Mood:  Anxious  Affect:  Congruent  Thought Process:  Goal Directed and Descriptions of Associations: Intact  Orientation:  Full (Time, Place, and Person)  Thought Content: Logical   Suicidal Thoughts:  No  Homicidal Thoughts:  No  Memory:  Immediate;   Fair Recent;   Fair Remote;   Fair  Judgement:  Fair  Insight:  Fair  Psychomotor Activity:  Normal  Concentration:  Concentration: Fair and Attention Span: Fair  Recall:  [employer] of Knowledge: Fair  Language: Fair  Akathisia:  No  Handed:  Right  AIMS (if indicated): not done  Assets:  Communication Skills Desire for Improvement Housing Social Support  ADL's:  Intact  Cognition: WNL  Sleep:  Fair   Screenings: Geneticist, Molecular Office Visit from 12/02/2023 in [facility]  AIMS Total Score 0      GAD-7    Flowsheet Row Office Visit from 12/02/2023 in [facility] Office Visit from 11/17/2023 in [facility] Counselor from 10/13/2023 in [facility] Video Visit from 09/26/2023 in [facility] Office Visit from 08/26/2023 in [facility]  Total GAD-7 Score 8 21 15 17 7       PHQ2-9    Flowsheet Row Office Visit from 12/02/2023 in [facility] Office Visit from 11/17/2023 in [facility] Counselor from 10/13/2023 in [facility] Office Visit from 08/26/2023 in [facility] Office Visit from 04/22/2023 in [facility]  PHQ-2 Total Score 2 4 2 2 2   PHQ-9 Total Score 9 19 12 7 5       Flowsheet Row Office Visit from 12/02/2023 in [facility] Office Visit from 11/17/2023 in [facility] Counselor from 10/13/2023 in [facility]  C-SSRS RISK CATEGORY Low Risk Low Risk No Risk        Assessment  and Plan: MISHAEL HARAN is a 38 year old Caucasian male, presents with adverse side effects to recent addition of Adderall currently improving since discontinuation of Adderall and initiation of risperidone , discussed assessment and plan as noted below.  ADHD-unstable Ongoing brain fog and difficulty focusing, likely due to untreated ADHD. Previous Vyvanse  20 mg trial ineffective. Nonstimulant options discussed due to adverse reactions to stimulants. Strattera  chosen as preferred nonstimulant. - Start Strattera  18 mg daily - Monitor liver function  Mood disorder (differential: Bipolar disorder versus major depressive disorder with mixed features versus side effect of Adderall) -improving Continues Lexapro  5 mg daily. No current self-harm thoughts. Risperdal  aiding anxiety more than Lexapro  alone. Discussed combination therapy and serotonin syndrome risks. Patient prefers current regimen. - Continue Lexapro  5 mg daily - Continue risperidone  0.25 mg at bedtime. - Monitor for serotonin syndrome - Follow-up in 4 weeks via video visit  Generalized anxiety disorder-improving Anxiety currently reported as improving on the current medication regimen. - Continue Lexapro  5 mg daily - Continue propranolol  20 mg twice a day as needed - Follow-up in 4 weeks via video visit   Adverse reaction to medication-Adderall-improving Improvement in anxiety with Risperdal . Experiences mild muscle spasms at bedtime and  mental fog, no significant impact on daily functioning. Increased caffeine intake noted. Patient prefers to continue Risperdal  despite side effects. - Continue Risperdal  as prescribed.  Long-term plan to taper it off. - Monitor for muscle spasms and mental fog - Order EKG and prolactin level  Sleep apnea/insomnia-improving Currently reports sleep was better. -Continue sleep hygiene techniques and encouraged use of CPAP or mouthpiece for sleep apnea.  Tobacco use/caffeine use disorder-unstable Currently reports improvement with caffeine use. -Encouraged to continue to limit use.  At risk for prolonged QT syndrome/high risk medication use-I have ordered EKG.  Patient to call [phone].  I have ordered labs-prolactin level.  Currently not due for other labs including hemoglobin A1c, lipid panel.  Patient to go to [facility] lab.   Follow-up - Schedule video visit in 4 weeks.  Consent: Patient/Guardian gives verbal consent for treatment and assignment of benefits for services provided during this visit. Patient/Guardian expressed understanding and agreed to proceed.   This note was generated in part or whole with voice recognition software. Voice recognition is usually quite accurate but there are transcription errors that can and very often do occur. I apologize for any typographical errors that were not detected and corrected.    Jeffery Gritz, MD 12/03/2023, 1:23 PM

## 2023-12-02 NOTE — Patient Instructions (Signed)
 Please call for EKG - [phone]   Atomoxetine Capsules What is this medication? ATOMOXETINE (AT oh mox e teen) treats attention-deficit hyperactivity disorder (ADHD). It works by improving focus and reducing impulsive behavior. It belongs to a group of medications called SNRIs. This medicine may be used for other purposes; ask your health care provider or pharmacist if you have questions. COMMON BRAND NAME(S): Strattera What should I tell my care team before I take this medication? They need to know if you have any of these conditions: Glaucoma High or low blood pressure History of stroke Irregular heartbeat or other cardiac disease Liver disease Mania or bipolar disorder Pheochromocytoma Suicidal thoughts, plans, or attempt by you or a family member An unusual or allergic reaction to atomoxetine, other medications, foods, dyes, or preservatives Pregnant or trying to get pregnant Breastfeeding How should I use this medication? Take this medication by mouth with water. Take it as directed on the prescription label at the same time every day. Do not cut, crush, or chew this medication. Swallow the capsules whole. You can take it with or without food. If it upsets your stomach, take it with food. If you have difficulty sleeping, and you take more than 1 dose per day, take your last dose before 6 PM. Keep taking it unless your care team tells you to stop. A special MedGuide will be given to you by the pharmacist with each prescription and refill. Be sure to read this information carefully each time. Talk to your care team about the use of this medication in children. While it may be prescribed for children as young as 6 years for selected conditions, precautions do apply. Overdosage: If you think you have taken too much of this medicine contact a poison control center or emergency room at once. NOTE: This medicine is only for you. Do not share this medicine with others. What if I miss a  dose? If you miss a dose, take it as soon as you can. If it is almost time for your next dose, take only that dose. Do not take double or extra doses. What may interact with this medication? Do not take this medication with any of the following: Cisapride Dronedarone MAOIs, such as Carbex, Eldepryl, Marplan, Nardil, and Parnate Pimozide Reboxetine Thioridazine This medication may also interact with the following: Certain medications for blood pressure, heart disease, irregular heart beat Certain medications for lung disease, such as albuterol Certain medications for mental heath conditions Cold or allergy medications Dofetilide Fluoxetine Medications that increase blood pressure, such as dopamine, dobutamine, or ephedrine Other medications that cause heart rhythm changes Paroxetine Quinidine Stimulant medications for ADHD, weight loss, or staying awake Ziprasidone This list may not describe all possible interactions. Give your health care provider a list of all the medicines, herbs, non-prescription drugs, or dietary supplements you use. Also tell them if you smoke, drink alcohol, or use illegal drugs. Some items may interact with your medicine. What should I watch for while using this medication? Visit your care team for regular checks on your progress. It may take a week or more before you see the benefit from this medication. This is why it is very important to continue taking the medication and not miss any doses. If you have been taking this medication regularly for some time, do not suddenly stop taking it. Ask your care team for advice. Rarely, this medication may increase thoughts of suicide or suicide attempts in children and teenagers. Call your child's care team  right away if your child or teenager has new or increased thoughts of suicide or has changes in mood or behavior like becoming irritable or anxious. Regularly monitor your child for these behavioral changes. Contact you  care team right away if you have an erection that lasts longer than 4 hours or if it becomes painful. This may be a sign of serious problem and must be treated right away to prevent permanent damage. This medication may affect your coordination, reaction time, or judgment. Do not drive or operate machinery until you know how this medication affects you. Sit up or stand slowly to reduce the risk of dizzy or fainting spells. Drinking alcohol with this medication can increase the risk of these side effects. Do not treat yourself for coughs, colds, or allergies without asking your care team for advice. Some ingredients can increase possible side effects. Your mouth may get dry. Chewing sugarless gum or sucking hard candy and drinking plenty of water will help. What side effects may I notice from receiving this medication? Side effects that you should report to your care team as soon as possible: Allergic reactions--skin rash, itching, hives, swelling of the face, lips, tongue, or throat Heart rhythm changes--fast or irregular heartbeat, dizziness, feeling faint or lightheaded, chest pain, trouble breathing Increase in blood pressure Liver injury-- right upper belly pain, loss of appetite, nausea, light-colored stool, dark yellow or brown urine, yellowing skin or eyes, unusual weakness or fatigue Mood and behavior changes--anxiety, nervousness, confusion, hallucinations, irritability, hostility, thoughts of suicide or self-harm, worsening mood, feelings of depression Painful or prolonged erection Stroke in adults--sudden numbness or weakness of the face, arm, or leg, trouble speaking, confusion, trouble walking, loss of balance or coordination, dizziness, severe headache, change in vision Trouble passing urine Side effects that usually do not require medical attention (report to your care team if they continue or are bothersome): Change in sex drive or performance Constipation Dizziness Dry mouth Loss  of appetite Nausea Stomach pain Vomiting This list may not describe all possible side effects. Call your doctor for medical advice about side effects. You may report side effects to FDA at 1-800-FDA-1088. Where should I keep my medication? Keep out of the reach of children and pets. Store at room temperature between 15 and 30 degrees C (59 and 86 degrees F). Throw away any unused medication after the expiration date. NOTE: This sheet is a summary. It may not cover all possible information. If you have questions about this medicine, talk to your doctor, pharmacist, or health care provider.  2024 Elsevier/Gold Standard (2022-05-27 00:00:00)

## 2023-12-03 ENCOUNTER — Other Ambulatory Visit
Admission: RE | Admit: 2023-12-03 | Discharge: 2023-12-03 | Disposition: A | Discharge status: Home / Self Care | Payer: BC Managed Care – PPO | Attending: Psychiatry | Admitting: Psychiatry

## 2023-12-03 DIAGNOSIS — Z79899 Other long term (current) drug therapy: Secondary | ICD-10-CM | POA: Insufficient documentation

## 2023-12-04 LAB — PROLACTIN: Prolactin: 15.5 ng/mL (ref 3.9–22.7)

## 2023-12-05 ENCOUNTER — Telehealth (INDEPENDENT_AMBULATORY_CARE_PROVIDER_SITE_OTHER): Payer: BC Managed Care – PPO | Admitting: Primary Care

## 2023-12-05 ENCOUNTER — Telehealth: Payer: BC Managed Care – PPO | Admitting: Primary Care

## 2023-12-05 DIAGNOSIS — G4730 Sleep apnea, unspecified: Secondary | ICD-10-CM | POA: Diagnosis not present

## 2023-12-05 DIAGNOSIS — G4719 Other hypersomnia: Secondary | ICD-10-CM | POA: Diagnosis not present

## 2023-12-05 NOTE — Telephone Encounter (Signed)
See my chart message

## 2023-12-05 NOTE — Patient Instructions (Signed)

## 2023-12-05 NOTE — Progress Notes (Signed)
Virtual Visit via Video Note  I connected with Jeffery Gomez on 12/05/23 at  3:30 PM EST by a video enabled telemedicine application and verified that I am speaking with the correct person using two identifiers.  Location: Patient: Home Provider: Office    I discussed the limitations of evaluation and management by telemedicine and the availability of in person appointments. The patient expressed understanding and agreed to proceed.  History of Present Illness:  38 year old male, current someday smoker.  Past medical history significant for generalized anxiety disorder, bipolar disorder, tobacco use.  Previous [facility] pulmonary encounter:  03/20/2022 Patient presents today for sleep consult. He reports symptoms of non-restorative sleep. He has on occasion woken up gasping for air. His wife has told him that he snores. He does not feel well rested when he wakes up in the morning. He has associated daytime sleepiness/fatigue. Typical bedtime is between 10-11pm. He has no trouble falling or staying asleep. He sleeps better and feels more rested when he falls asleep on the couch laying on his side. He starts his day at 6-6:30am. He has chronic sinus congestion, recently started taking xyzal two days ago and will use OTC nasal spray as needed. He is an occasional smoker, 1 pack of cigarettes will last him a week. He has rare shortness of breath symptoms. Denies narcolepsy, cataplexy or sleep walking.   Sleep questionnaire Symptoms-occasional snoring/wakes up gasping for air, nonrestorative sleep Sleep study-none prior Bedtime- 10-11pm Time to fall asleep- 30 mins Nocturnal awakenings- zero Start day- 6-6:30am Do you operate heavy machinery- No Weight changes in 2 years- 20 lbs Do you wear cpap- non Do you wear oxygen- no Epworth-11   07/30/2022 Patient contacted today to review sleep study results.  Patient has symptoms of occasional snoring, wakes up gasping for air and nonrestorative sleep.   HST 05/04/22 showed mild OSA, AHI 7.6/hour. Reviewed sleep study results with patient. He mainly sleeps on his back, he does have an adjustable bed frame. He sleeps better when in recliner. He will consider oral appliance for treatment of OSA in addition to positional sleep.   Mild sleep apnea: - Home sleep study 05/04/22>> AHI 7.6/hour with SpO2 low 84% (average 93%). Apneas were mainly obstructive. Advised patient to focus on positional sleep, recommend he avoid back sleeping position or elevate HOB 30 degrees. We will also refer him to orthodontics for consideration for oral appliance to help with snoring and mild apneas. He does not need CPAP right now unless his sleep symptoms worsen.  Advised against driving if experiencing excessive daytime sleepiness or fatigue.   12/05/2023- interim hx  Discussed the use of AI scribe software for clinical note transcription with the patient, who gave verbal consent to proceed.  Patient contacted today for virtual visit/ follow-up mild OSA.  Patient was seen for sleep consult in May 2023 to occasional snoring/waking up gasping for air and nonrestorative sleep.  Patient had home sleep study on May 04, 2022 that showed mild obstructive sleep apnea, AHI 7.6/h with SpO2 low 84% (average 93%).  We reviewed sleep study results, risks of untreated sleep apnea and treatment options.  Treatment plan set forth included weight loss and referral to ENT for consideration for oral appliance.  CPAP not recommended as patient is not significantly impacted by daytime sleepiness.  He reports a weight loss of approximately 10 pounds since his last visit, attributed to the appetite-suppressing effects of Adderall. However, despite this weight loss, the patient continues to experience snoring and  fatigue, indicating that the weight loss has not significantly improved his sleep apnea symptoms.  The patient had previously considered an oral appliance as a potential treatment for his  sleep apnea, but ultimately decided against it due to the high out-of-pocket cost and concerns about potential discomfort and long-term effects on his jaw. He did try an over-the-counter mouthpiece from a local pharmacy, which he reports helped with his snoring, but he does not wish to continue this as a long-term solution.  The patient was previously on Adderall, which he reports led to a significant weight loss of around 30 pounds. However, he has since discontinued this medication due to a negative reaction that resulted in depression. Prior to Adderall, he was on Vyvanse, but it is unclear how this medication affected his sleep apnea symptoms.  Overall, the patient expresses a willingness to try a CPAP machine as a potential treatment for his sleep apnea, citing a preference for this option over the oral appliance due to the lower cost and fewer potential side effects. He understands that consistent use of the CPAP machine will be necessary for it to be effective.     Observations/Objective:  Appears well without overt respiratory symptoms   Assessment and Plan:  1. Mild sleep apnea (Primary) - AMB REFERRAL FOR DME  2. Excessive daytime sleepiness - AMB REFERRAL FOR DME     Mild Sleep Apnea Patient reports persistent snoring and fatigue despite weight loss. Previous consideration of oral appliance was deterred by cost. Patient has tried over-the-counter mouthpiece with some improvement but has concerns about long-term use. He was also tried on Vyvanse and Adderall but reported adverse side effects.  -Order CPAP machine for patient, auto settings 5-15cm h20. If insurance requires, repeat home sleep study due to previous study being over a year old. -Advise patient to use CPAP every night for at least 4 hours.  Follow Up Instructions:  -Schedule follow-up in 2-3 months for compliance check.   I discussed the assessment and treatment plan with the patient. The patient was provided an  opportunity to ask questions and all were answered. The patient agreed with the plan and demonstrated an understanding of the instructions.   The patient was advised to call back or seek an in-person evaluation if the symptoms worsen or if the condition fails to improve as anticipated.  I provided 22 minutes of non-face-to-face time during this encounter.   Glenford Bayley, NP

## 2023-12-09 ENCOUNTER — Other Ambulatory Visit: Payer: Self-pay | Admitting: Psychiatry

## 2023-12-09 DIAGNOSIS — F0630 Mood disorder due to known physiological condition, unspecified: Secondary | ICD-10-CM

## 2023-12-12 ENCOUNTER — Ambulatory Visit (INDEPENDENT_AMBULATORY_CARE_PROVIDER_SITE_OTHER): Payer: BC Managed Care – PPO | Admitting: Licensed Clinical Social Worker

## 2023-12-12 DIAGNOSIS — Z91199 Patient's noncompliance with other medical treatment and regimen due to unspecified reason: Secondary | ICD-10-CM

## 2023-12-12 NOTE — Progress Notes (Signed)
  Clinician attempted session via telehealth, but Jeffery Gomez did not join. Cln sent url link to join telehealth visit to the pt's cell, but pt still did not log in.

## 2023-12-15 DIAGNOSIS — G4733 Obstructive sleep apnea (adult) (pediatric): Secondary | ICD-10-CM | POA: Diagnosis not present

## 2023-12-22 ENCOUNTER — Ambulatory Visit: Payer: BC Managed Care – PPO | Admitting: Psychiatry

## 2023-12-24 ENCOUNTER — Other Ambulatory Visit: Payer: Self-pay | Admitting: Psychiatry

## 2023-12-24 DIAGNOSIS — F902 Attention-deficit hyperactivity disorder, combined type: Secondary | ICD-10-CM

## 2024-01-02 ENCOUNTER — Encounter: Payer: Self-pay | Admitting: Psychiatry

## 2024-01-02 ENCOUNTER — Telehealth: Payer: BC Managed Care – PPO | Admitting: Psychiatry

## 2024-01-02 DIAGNOSIS — Z9189 Other specified personal risk factors, not elsewhere classified: Secondary | ICD-10-CM

## 2024-01-02 DIAGNOSIS — F411 Generalized anxiety disorder: Secondary | ICD-10-CM

## 2024-01-02 DIAGNOSIS — F3181 Bipolar II disorder: Secondary | ICD-10-CM | POA: Insufficient documentation

## 2024-01-02 DIAGNOSIS — F902 Attention-deficit hyperactivity disorder, combined type: Secondary | ICD-10-CM | POA: Diagnosis not present

## 2024-01-02 DIAGNOSIS — F1590 Other stimulant use, unspecified, uncomplicated: Secondary | ICD-10-CM

## 2024-01-02 DIAGNOSIS — F17200 Nicotine dependence, unspecified, uncomplicated: Secondary | ICD-10-CM

## 2024-01-02 DIAGNOSIS — F17220 Nicotine dependence, chewing tobacco, uncomplicated: Secondary | ICD-10-CM

## 2024-01-02 NOTE — Patient Instructions (Signed)
Please call for [phone]

## 2024-01-02 NOTE — Progress Notes (Signed)
Virtual Visit via Video Note  I connected with Jeffery Gomez on 01/02/24 at  9:00 AM EST by a video enabled telemedicine application and verified that I am speaking with the correct person using two identifiers.  Location Provider Location : [facility] Patient Location : Work  Participants: Patient , Provider   I discussed the limitations of evaluation and management by telemedicine and the availability of in person appointments. The patient expressed understanding and agreed to proceed.   I discussed the assessment and treatment plan with the patient. The patient was provided an opportunity to ask questions and all were answered. The patient agreed with the plan and demonstrated an understanding of the instructions.   The patient was advised to call back or seek an in-person evaluation if the symptoms worsen or if the condition fails to improve as anticipated.   BH MD OP Progress Note  01/02/2024 2:20 PM Jeffery Gomez  MRN:  [id number]  Chief Complaint:  Chief Complaint  Patient presents with   Follow-up   Medication Refill   HPI: Jeffery Gomez is a 38 year old Caucasian male, employed, lives in [place], married, has a history of GAD, ADHD, insomnia, caffeine use disorder, obstructive sleep apnea was evaluated by telemedicine today.  The patient presents for follow-up regarding mood stabilization and ADHD management.  The patient has been taking Lexapro 5 mg and risperidone 0.25 mg, which have improved his symptoms. Propranolol is used as needed. Recently, he started Strattera for ADHD symptoms, initially taking it in the evening but switched to lunchtime due to insomnia. He has been on this new regimen for about a week without side effects.  There is a significant improvement in anxiety and worry, attributed to the combination of risperidone and Lexapro. Previous side effects from risperidone, such as spasms, have resolved. He reports better focus and concentration,  although not fully optimal yet, and attributes this to the recent initiation of Strattera.  He recently obtained a CPAP machine for sleep apnea and reports using it, although he sometimes wakes up feeling suffocated. He is working on adjusting the machine and mask. He reports feeling less groggy in the mornings and believes his sleep quality has improved.  Denies suicidal thoughts or significant negative thoughts.  Denies any homicidality or perceptual disturbances.    Visit Diagnosis:    ICD-10-CM   1. Generalized anxiety disorder  F41.1     2. Bipolar 2 disorder, major depressive episode (HCC)  F31.81    with mixed features    3. Attention deficit hyperactivity disorder (ADHD), combined type  F90.2     4. At risk for prolonged QT interval syndrome  Z91.89 EKG 12-Lead    5. 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 Tobacco use disorder  F17.200       Past Psychiatric History: I have reviewed past psychiatric history from progress note on 04/24/2022.  Past trials of medications like BuSpar-side effect, Depakote-side effect, Lexapro, Zyprexa, Vyvanse, Adderall-psychosis/suicidality  Past Medical History:  Past Medical History:  Diagnosis Date   Anxiety    Bipolar disorder (HCC)    Panic attacks     Past Surgical History:  Procedure Laterality Date   NO PAST SURGERIES      Family Psychiatric History: I have reviewed family psychiatric history from progress note on 04/24/2022.  Family History:  Family History  Problem Relation Age of Onset   Anxiety disorder Mother    Panic disorder Mother    Bipolar disorder Father  Schizophrenia Father    Alcohol abuse Brother    Bipolar disorder Brother    ADD / ADHD Brother    Anxiety disorder Brother    Bipolar disorder Maternal Aunt    Alcohol abuse Maternal Grandfather    Cirrhosis Maternal Grandfather    Heart attack Maternal Grandmother    Bipolar disorder Paternal Grandmother    Thyroid disease Paternal Grandmother      Social History: I have reviewed social history from progress note on 04/24/2022. Social History   Socioeconomic History   Marital status: Married    Spouse name: Chelsie   Number of children: 3   Years of education: 11 TH GRADE   Highest education level: Not on file  Occupational History   Not on file  Tobacco Use   Smoking status: Former    Types: Cigarettes   Smokeless tobacco: Current    Types: Chew   Tobacco comments:    1 pack per week--03/20/2022  Vaping Use   Vaping status: Never Used  Substance and Sexual Activity   Alcohol use: Yes    Comment: occasinal   Drug use: No   Sexual activity: Yes    Partners: Female  Other Topics Concern   Not on file  Social History Narrative   Works at [employer]   Social Drivers of [employer] Strain: Low Risk  (03/31/2023)   Overall Financial Resource Strain (CARDIA)    Difficulty of Paying Living Expenses: Not hard at all  Food Insecurity: No Food Insecurity (03/31/2023)   Hunger Vital Sign    Worried About Running Out of Food in the Last Year: Never true    Ran Out of Food in the Last Year: Never true  Transportation Needs: No Transportation Needs (03/31/2023)   PRAPARE - Administrator, Civil Service (Medical): No    Lack of Transportation (Non-Medical): No  Physical Activity: Insufficiently Active (03/31/2023)   Exercise Vital Sign    Days of Exercise per Week: 3 days    Minutes of Exercise per Session: 30 min  Stress: No Stress Concern Present (03/31/2023)   [employer] of Occupational Health - Occupational Stress Questionnaire    Feeling of Stress : Only a little  Social Connections: Moderately Integrated (03/31/2023)   Social Connection and Isolation Panel [NHANES]    Frequency of Communication with Friends and Family: More than three times a week    Frequency of Social Gatherings with Friends and Family: More than three times a week    Attends Religious Services: 1 to 4 times per year     Active Member of [employer] or Organizations: No    Attends Banker Meetings: Never    Marital Status: Married    Allergies:  Allergies  Allergen Reactions   Adderall [Amphetamine-Dextroamphetamine] Other (See Comments)    Crying , paranoia , suicidality   Depakote [Divalproex Sodium] Anaphylaxis    Metabolic Disorder Labs: Lab Results  Component Value Date   HGBA1C 5.9 (H) 03/31/2023   MPG 123 03/31/2023   Lab Results  Component Value Date   PROLACTIN 15.5 12/03/2023   Lab Results  Component Value Date   CHOL 158 03/31/2023   TRIG 83 03/31/2023   HDL 40 03/31/2023   CHOLHDL 4.0 03/31/2023   LDLCALC 101 (H) 03/31/2023   Lab Results  Component Value Date   TSH 1.12 03/19/2022   TSH 1.03 12/30/2017    Therapeutic Level Labs: No results found for: "LITHIUM" No results  found for: "VALPROATE" No results found for: "CBMZ"  Current Medications: Current Outpatient Medications  Medication Sig Dispense Refill   atomoxetine (STRATTERA) 18 MG capsule TAKE 1 CAPSULE BY MOUTH DAILY. 90 capsule 0   Azelastine HCl 137 MCG/SPRAY SOLN      clobetasol (TEMOVATE) 0.05 % external solution SMARTSIG:Sparingly Topical Twice Daily     escitalopram (LEXAPRO) 5 MG tablet TAKE 1 TABLET (5 MG TOTAL) BY MOUTH DAILY. 90 tablet 1   fluticasone (FLONASE) 50 MCG/ACT nasal spray Place 2 sprays into both nostrils daily. 16 g 6   propranolol (INDERAL) 20 MG tablet TAKE 1 TABLET (20 MG TOTAL) BY MOUTH 2 (TWO) TIMES DAILY AS NEEDED. FOR ANXIETY 180 tablet 1   risperiDONE (RISPERDAL) 0.25 MG tablet TAKE 1 TABLET BY MOUTH AT BEDTIME. 90 tablet 1   No current facility-administered medications for this visit.     Musculoskeletal: Strength & Muscle Tone:  UTA Gait & Station:  Seated Patient leans: N/A  Psychiatric Specialty Exam: Review of Systems  Psychiatric/Behavioral:  Positive for decreased concentration and sleep disturbance.        Mood swings    There were no vitals taken for  this visit.There is no height or weight on file to calculate BMI.  General Appearance: Fairly Groomed  Eye Contact:  Fair  Speech:  Normal Rate  Volume:  Normal  Mood:   Mood swings improving  Affect:  Congruent  Thought Process:  Goal Directed and Descriptions of Associations: Intact  Orientation:  Full (Time, Place, and Person)  Thought Content: Logical   Suicidal Thoughts:  No  Homicidal Thoughts:  No  Memory:  Immediate;   Fair Recent;   Fair Remote;   Fair  Judgement:  Fair  Insight:  Fair  Psychomotor Activity:  Normal  Concentration:  Concentration: Fair and Attention Span: Fair  Recall:  [employer] of Knowledge: Fair  Language: Fair  Akathisia:  No  Handed:  Right  AIMS (if indicated): not done  Assets:  Manufacturing systems engineer Desire for Improvement Housing Social Support Transportation  ADL's:  Intact  Cognition: WNL  Sleep:   improving   Screenings: Geneticist, molecular Office Visit from 12/02/2023 in [facility]  AIMS Total Score 0      GAD-7    Flowsheet Row Office Visit from 12/02/2023 in [facility] Office Visit from 11/17/2023 in [facility] Counselor from 10/13/2023 in [facility] Video Visit from 09/26/2023 in [facility] Office Visit from 08/26/2023 in [facility]  Total GAD-7 Score 8 21 15 17 7       PHQ2-9    Flowsheet Row Office Visit from 12/02/2023 in [facility] Office Visit from 11/17/2023 in [facility] Counselor from 10/13/2023 in [facility] Office Visit from 08/26/2023 in [facility] Office Visit from 04/22/2023 in [facility]  PHQ-2 Total Score 2 4 2 2 2   PHQ-9 Total Score 9 19 12 7 5       Flowsheet Row Video Visit from 01/02/2024 in [facility] Office Visit from 12/02/2023 in [facility] Office Visit from 11/17/2023 in [facility]  C-SSRS RISK CATEGORY No Risk Low Risk Low Risk  Assessment and Plan: Jeffery Gomez is a 38 year old Caucasian male, with history of mood swings, ADHD, anxiety presents with improvement of his mood swings on the current medication regimen positive response to Strattera for ADHD.  Patient with episodes of euphoria, high energy, risk-taking behavior which can last for a couple of days and episodes of significant depression symptoms as well as family history of bipolar disorder currently meets criteria for bipolar type II disorder, discussed assessment and plan as noted below.  Bipolar disorder type II major depressive episode, moderate-in partial remission Patient with episodic highs and lows currently with good response to current medication regimen.  Currently tolerating his risperidone well, denies side effects. - Continue Risperidone 0.25 mg at bedtime.  Generalized anxiety disorder-improving Currently reports anxiety symptoms as manageable on the current medication regimen. - Continue Lexapro 5 mg daily - Continue Propranolol 20 mg twice a day as needed.  ADHD-improving Patient reports motivation and focus has improved as well.  Was able to get CPAP device for his obstructive sleep apnea and has been trying to use it regularly.  That also seems to have helped with his brain fog and difficulty focusing.  Just started taking the Strattera a week ago and is currently taking it regularly. - Continue Strattera 18 mg daily - Continue CPAP for OSA.  Tobacco use/caffeine use disorder-improving Patient advised to continue to cut  back. - Will monitor closely.  Labs-reviewed and discussed with patient-prolactin level dated 12/03/2023-within normal limits.  Pending EKG-patient agrees to get it completed EKG order in place.  Follow-up in clinic in 2 to 3 months or sooner if needed.  Follow-up scheduled for April 25 at 11:40 AM.  Collaboration of Care: Collaboration of Care: Referral or follow-up with counselor/therapist AEB patient encouraged to schedule an appointment with therapist, recently missed an appointment  with Ms. Renee Ramus.  Patient/Guardian was advised Release of Information must be obtained prior to any record release in order to collaborate their care with an outside provider. Patient/Guardian was advised if they have not already done so to contact the registration department to sign all necessary forms in order for [place] to release information regarding their care.   Consent: Patient/Guardian gives verbal consent for treatment and assignment of benefits for services provided during this visit. Patient/Guardian expressed understanding and agreed to proceed.   This note was generated in part or whole with voice recognition software. Voice recognition is usually quite accurate but there are transcription errors that can and very often do occur. I apologize for any typographical errors that were not detected and corrected.    Jomarie Longs, MD 01/02/2024, 2:20 PM

## 2024-01-06 ENCOUNTER — Encounter: Payer: Self-pay | Admitting: Licensed Clinical Social Worker

## 2024-01-15 DIAGNOSIS — G4733 Obstructive sleep apnea (adult) (pediatric): Secondary | ICD-10-CM | POA: Diagnosis not present

## 2024-02-12 DIAGNOSIS — G4733 Obstructive sleep apnea (adult) (pediatric): Secondary | ICD-10-CM | POA: Diagnosis not present

## 2024-03-12 ENCOUNTER — Telehealth: Payer: BC Managed Care – PPO | Admitting: Psychiatry

## 2024-03-12 ENCOUNTER — Encounter: Payer: Self-pay | Admitting: Psychiatry

## 2024-03-12 DIAGNOSIS — F411 Generalized anxiety disorder: Secondary | ICD-10-CM

## 2024-03-12 DIAGNOSIS — F902 Attention-deficit hyperactivity disorder, combined type: Secondary | ICD-10-CM | POA: Diagnosis not present

## 2024-03-12 DIAGNOSIS — F17200 Nicotine dependence, unspecified, uncomplicated: Secondary | ICD-10-CM

## 2024-03-12 DIAGNOSIS — F3181 Bipolar II disorder: Secondary | ICD-10-CM

## 2024-03-12 DIAGNOSIS — F17220 Nicotine dependence, chewing tobacco, uncomplicated: Secondary | ICD-10-CM

## 2024-03-12 DIAGNOSIS — F1590 Other stimulant use, unspecified, uncomplicated: Secondary | ICD-10-CM

## 2024-03-12 DIAGNOSIS — Z9189 Other specified personal risk factors, not elsewhere classified: Secondary | ICD-10-CM

## 2024-03-12 MED ORDER — ATOMOXETINE HCL 18 MG PO CAPS
18.0000 mg | ORAL_CAPSULE | Freq: Every day | ORAL | 0 refills | Status: DC
Start: 2024-03-12 — End: 2024-08-19

## 2024-03-12 NOTE — Patient Instructions (Signed)
 Please call for [phone]

## 2024-03-12 NOTE — Progress Notes (Signed)
 Virtual Visit via Video Note  I connected with Jeffery Gomez on 03/12/24 at 11:40 AM EDT by a video enabled telemedicine application and verified that I am speaking with the correct person using two identifiers.  Location Provider Location : [facility] Patient Location : Work  Participants: Patient , Provider   I discussed the limitations of evaluation and management by telemedicine and the availability of in person appointments. The patient expressed understanding and agreed to proceed.   I discussed the assessment and treatment plan with the patient. The patient was provided an opportunity to ask questions and all were answered. The patient agreed with the plan and demonstrated an understanding of the instructions.   The patient was advised to call back or seek an in-person evaluation if the symptoms worsen or if the condition fails to improve as anticipated.   BH MD OP Progress Note  03/13/2024 8:24 AM Jeffery Gomez  MRN:  [id number]  Chief Complaint:  Chief Complaint  Patient presents with   Anxiety   Depression   Medication Refill   Follow-up   Discussed the use of AI scribe software for clinical note transcription with the patient, who gave verbal consent to proceed.  History of Present Illness Jeffery Gomez "Jeffery Gomez" is a 38 year old Caucasian male, employed, lives in [place], married, has a history of GAD, ADHD, insomnia, caffeine use disorder, obstructive sleep apnea was evaluated by telemedicine today for a follow-up appointment.  He has a history of generalized anxiety, bipolar II disorder, and ADHD. His current medications include risperidone  0.25 mg for bipolar disorder, Lexapro  5 mg, propranolol  20 mg twice a day for anxiety, and Strattera  18 mg for ADHD. He has not been taking Strattera  consistently due to forgetfulness and concerns about temperature fluctuations if left in his vehicle. He missed his risperidone  doses for the last two nights but has been  taking it regularly otherwise. He notes improvement in his thoughts, describing them as 'not going crazy,' and has not experienced significant side effects, though he mentions occasional muscle spasms.  He has been using a CPAP machine for obstructive sleep apnea but reports noncompliance over the last two to three days due to exhaustion. He experiences discomfort with the mask, causing soreness on the bridge of his nose.  He recalls an EKG was ordered in February but was not completed due to a system issue. He also underwent blood work to monitor prolactin levels, which were normal.  Denies thoughts of self-harm or harm to others.        Visit Diagnosis:    ICD-10-CM   1. Generalized anxiety disorder  F41.1     2. Bipolar 2 disorder, major depressive episode (HCC)  F31.81    With mixed features    3. Attention deficit hyperactivity disorder (ADHD), combined type  F90.2 atomoxetine  (STRATTERA ) 18 MG capsule    4. At risk for prolonged QT interval syndrome  Z91.89     5. 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 Tobacco use disorder  F17.200       Past Psychiatric History: I have reviewed past psychiatric history from progress note on 04/24/2022.  Past trials of medications like BuSpar -side effect, Depakote-side effect, Lexapro , Zyprexa, Vyvanse , Adderall-psychosis/suicidality.  Past Medical History:  Past Medical History:  Diagnosis Date   Anxiety    Bipolar disorder (HCC)    Panic attacks     Past Surgical History:  Procedure Laterality Date   NO PAST SURGERIES  Family Psychiatric History: I have reviewed family psychiatric history from progress note on 04/24/2022.  Family History:  Family History  Problem Relation Age of Onset   Anxiety disorder Mother    Panic disorder Mother    Bipolar disorder Father    Schizophrenia Father    Alcohol abuse Brother    Bipolar disorder Brother    ADD / ADHD Brother    Anxiety disorder Brother    Bipolar disorder Maternal Aunt     Alcohol abuse Maternal Grandfather    Cirrhosis Maternal Grandfather    Heart attack Maternal Grandmother    Bipolar disorder Paternal Grandmother    Thyroid disease Paternal Grandmother     Social History: I have reviewed social history from progress note on 04/24/2022. Social History   Socioeconomic History   Marital status: Married    Spouse name: Chelsie   Number of children: 3   Years of education: 11 TH GRADE   Highest education level: Not on file  Occupational History   Not on file  Tobacco Use   Smoking status: Former    Types: Cigarettes   Smokeless tobacco: Current    Types: Chew   Tobacco comments:    1 pack per week--03/20/2022  Vaping Use   Vaping status: Never Used  Substance and Sexual Activity   Alcohol use: Yes    Comment: occasinal   Drug use: No   Sexual activity: Yes    Partners: Female  Other Topics Concern   Not on file  Social History Narrative   Works at [employer]   Social Drivers of [employer] Strain: Low Risk  (03/31/2023)   Overall Financial Resource Strain (CARDIA)    Difficulty of Paying Living Expenses: Not hard at all  Food Insecurity: No Food Insecurity (03/31/2023)   Hunger Vital Sign    Worried About Running Out of Food in the Last Year: Never true    Ran Out of Food in the Last Year: Never true  Transportation Needs: No Transportation Needs (03/31/2023)   PRAPARE - Administrator, Civil Service (Medical): No    Lack of Transportation (Non-Medical): No  Physical Activity: Insufficiently Active (03/31/2023)   Exercise Vital Sign    Days of Exercise per Week: 3 days    Minutes of Exercise per Session: 30 min  Stress: No Stress Concern Present (03/31/2023)   [employer] of Occupational Health - Occupational Stress Questionnaire    Feeling of Stress : Only a little  Social Connections: Moderately Integrated (03/31/2023)   Social Connection and Isolation Panel [NHANES]    Frequency of Communication with  Friends and Family: More than three times a week    Frequency of Social Gatherings with Friends and Family: More than three times a week    Attends Religious Services: 1 to 4 times per year    Active Member of [employer] or Organizations: No    Attends Banker Meetings: Never    Marital Status: Married    Allergies:  Allergies  Allergen Reactions   Adderall [Amphetamine -Dextroamphetamine] Other (See Comments)    Crying , paranoia , suicidality   Depakote [Divalproex Sodium] Anaphylaxis    Metabolic Disorder Labs: Lab Results  Component Value Date   HGBA1C 5.9 (H) 03/31/2023   MPG 123 03/31/2023   Lab Results  Component Value Date   PROLACTIN 15.5 12/03/2023   Lab Results  Component Value Date   CHOL 158 03/31/2023   TRIG 83 03/31/2023  HDL 40 03/31/2023   CHOLHDL 4.0 03/31/2023   LDLCALC 101 (H) 03/31/2023   Lab Results  Component Value Date   TSH 1.12 03/19/2022   TSH 1.03 12/30/2017    Therapeutic Level Labs: No results found for: "LITHIUM" No results found for: "VALPROATE" No results found for: "CBMZ"  Current Medications: Current Outpatient Medications  Medication Sig Dispense Refill   atomoxetine  (STRATTERA ) 18 MG capsule Take 1 capsule (18 mg total) by mouth daily. 90 capsule 0   Azelastine HCl 137 MCG/SPRAY SOLN      clobetasol (TEMOVATE) 0.05 % external solution SMARTSIG:Sparingly Topical Twice Daily     escitalopram  (LEXAPRO ) 5 MG tablet TAKE 1 TABLET (5 MG TOTAL) BY MOUTH DAILY. 90 tablet 1   fluticasone  (FLONASE ) 50 MCG/ACT nasal spray Place 2 sprays into both nostrils daily. 16 g 6   propranolol  (INDERAL ) 20 MG tablet TAKE 1 TABLET (20 MG TOTAL) BY MOUTH 2 (TWO) TIMES DAILY AS NEEDED. FOR ANXIETY 180 tablet 1   risperiDONE  (RISPERDAL ) 0.25 MG tablet TAKE 1 TABLET BY MOUTH AT BEDTIME. 90 tablet 1   No current facility-administered medications for this visit.     Musculoskeletal: Strength & Muscle Tone:  UTA Gait & Station:   Seated Patient leans: N/A  Psychiatric Specialty Exam: Review of Systems  Psychiatric/Behavioral:  The patient is nervous/anxious.     There were no vitals taken for this visit.There is no height or weight on file to calculate BMI.  General Appearance: Casual  Eye Contact:  Fair  Speech:  Clear and Coherent  Volume:  Normal  Mood:  Anxious  Affect:  Appropriate  Thought Process:  Goal Directed and Descriptions of Associations: Intact  Orientation:  Full (Time, Place, and Person)  Thought Content: Logical   Suicidal Thoughts:  No  Homicidal Thoughts:  No  Memory:  Immediate;   Fair Recent;   Fair Remote;   Fair  Judgement:  Good  Insight:  Fair  Psychomotor Activity:  Normal  Concentration:  Concentration: Fair and Attention Span: Fair  Recall:  [employer] of Knowledge: Fair  Language: Fair  Akathisia:  No  Handed:  Right  AIMS (if indicated): not done  Assets:  Desire for Improvement Housing Social Support Transportation  ADL's:  Intact  Cognition: WNL  Sleep:   Improving   Screenings: Geneticist, molecular Office Visit from 12/02/2023 in [facility]  AIMS Total Score 0      GAD-7    Flowsheet Row Office Visit from 12/02/2023 in [facility] Office Visit from 11[facility]17/2023 in [facility] Counselor from 10/13/2023 in [facility] Video Visit from 09/26/2023 in [facility] Office Visit from 08/26/2023 in [facility]  Total GAD-7 Score 8 21 15 17 7       PHQ2-9    Flowsheet Row Office Visit from 12/02/2023 in [facility] Office Visit from 11/17/2023 in [facility] Counselor from 10/13/2023 in [facility] Office  Visit from 08/26/2023 in [facility] Office Visit from 04/22/2023 in [facility]  PHQ-2 Total Score 2 4 2 2 2   PHQ-9 Total Score 9 19 12 7 5       Flowsheet Row Video Visit from 03/12/2024 in [facility] Video  Visit from 01/02/2024 in [facility] Office Visit from 12/02/2023 in [facility]  C-SSRS RISK CATEGORY No Risk No Risk Low Risk        Assessment and Plan:Jeffery Gomez is a 38 year old Caucasian male with history of bipolar disorder type II, generalized anxiety disorder, ADHD, obstructive sleep apnea on CPAP was evaluated by telemedicine today, discussed assessment and plan as noted below.  Assessment & Plan Bipolar II disorder-in remission Bipolar II disorder is managed with risperidone  0.25 mg. He reports no significant side effects, though occasional muscle spasms occur. He missed doses for the last two nights but is otherwise compliant. The current dose effectively manages symptoms, with potential for future dosage increase if symptoms worsen. - Continue Risperidone  0.25 mg at night. - Monitor for symptom escalation that may necessitate dosage adjustment. - Ensure completion of EKG to monitor cardiac effects of Risperidone .  Generalized anxiety disorder-improving Generalized anxiety disorder is treated with Lexapro  5 mg and propranolol  20 mg twice daily. He is considering switching Lexapro  to evening dosing to improve adherence. No significant side effects are reported. - Continue Lexapro  5 mg to evening dosing. - Continue Propranolol  20 mg twice daily. - Monitor for sleep disturbances post Lexapro  dosing change.  ADHD-improving ADHD is managed with Strattera  18 mg. He has been inconsistent with medication due to forgetfulness and logistical issues. Plans to improve adherence  by taking Strattera  in the morning after switching Lexapro  to the evening. Potential for dosage increase if needed. - Switch Lexapro  to evening dosing to facilitate morning Strattera  dosing. - Take Strattera  18 mg in the morning. - Send a 90-day supply of Strattera  to the pharmacy. - Monitor adherence and consider dosage increase if symptoms persist.  Obstructive sleep apnea-improving Obstructive sleep apnea is managed with CPAP therapy. He has been noncompliant due to discomfort and exhaustion but is adapting to the device. He is considering purchasing a new mask to improve comfort and compliance. Insurance requires at least four hours of use per night to maintain coverage. - Encourage consistent CPAP use for at least four hours per night to ensure insurance coverage. - Consider purchasing a new CPAP mask for improved comfort. - Monitor compliance to prevent insurance from reclaiming the device.  At risk for prolonged QT syndrome-pending EKG.  Patient agrees to call [phone] to get it done.  Follow-up Follow-up in clinic in 3 months or sooner if needed.   Consent: Patient/Guardian gives verbal consent for treatment and assignment of benefits for services provided during this visit. Patient/Guardian expressed understanding and agreed to proceed.   This note was generated in part or whole with voice recognition software. Voice recognition is usually quite accurate but there are transcription errors that can and very often do occur. I apologize for any typographical errors that were not detected and corrected.    Daffney Greenly, MD 03/13/2024, 8:24 AM

## 2024-03-18 ENCOUNTER — Ambulatory Visit
Admission: RE | Admit: 2024-03-18 | Discharge: 2024-03-18 | Disposition: A | Discharge status: Home / Self Care | Source: Ambulatory Visit | Attending: Psychiatry | Admitting: Psychiatry

## 2024-03-18 DIAGNOSIS — Z5181 Encounter for therapeutic drug level monitoring: Secondary | ICD-10-CM | POA: Diagnosis not present

## 2024-03-18 DIAGNOSIS — Z9189 Other specified personal risk factors, not elsewhere classified: Secondary | ICD-10-CM | POA: Insufficient documentation

## 2024-03-21 ENCOUNTER — Other Ambulatory Visit: Payer: Self-pay | Admitting: Psychiatry

## 2024-03-21 DIAGNOSIS — F411 Generalized anxiety disorder: Secondary | ICD-10-CM

## 2024-04-02 DIAGNOSIS — M79645 Pain in left finger(s): Secondary | ICD-10-CM | POA: Diagnosis not present

## 2024-04-14 ENCOUNTER — Encounter: Payer: Self-pay | Admitting: Psychiatry

## 2024-04-14 ENCOUNTER — Telehealth (HOSPITAL_COMMUNITY): Payer: Self-pay | Admitting: Professional

## 2024-04-14 ENCOUNTER — Ambulatory Visit (INDEPENDENT_AMBULATORY_CARE_PROVIDER_SITE_OTHER): Admitting: Psychiatry

## 2024-04-14 ENCOUNTER — Other Ambulatory Visit: Payer: Self-pay

## 2024-04-14 ENCOUNTER — Telehealth: Payer: Self-pay

## 2024-04-14 VITALS — BP 117/78 | HR 67 | Temp 98.2°F | Ht 69.0 in | Wt 163.0 lb

## 2024-04-14 DIAGNOSIS — F1590 Other stimulant use, unspecified, uncomplicated: Secondary | ICD-10-CM

## 2024-04-14 DIAGNOSIS — F3181 Bipolar II disorder: Secondary | ICD-10-CM

## 2024-04-14 DIAGNOSIS — F902 Attention-deficit hyperactivity disorder, combined type: Secondary | ICD-10-CM | POA: Diagnosis not present

## 2024-04-14 DIAGNOSIS — F411 Generalized anxiety disorder: Secondary | ICD-10-CM | POA: Diagnosis not present

## 2024-04-14 DIAGNOSIS — F17200 Nicotine dependence, unspecified, uncomplicated: Secondary | ICD-10-CM

## 2024-04-14 MED ORDER — RISPERIDONE 0.5 MG PO TABS
0.5000 mg | ORAL_TABLET | Freq: Every day | ORAL | 1 refills | Status: DC
Start: 2024-04-14 — End: 2024-05-07

## 2024-04-14 NOTE — Progress Notes (Signed)
 [facility] MD OP Progress Note  04/14/2024 5:21 PM [name]  MRN:  [id number]  Chief Complaint:  Chief Complaint  Patient presents with   Follow-up   Depression   Anxiety   Medication Refill   Discussed the use of AI scribe software for clinical note transcription with the patient, who gave verbal consent to proceed.  History of Present Illness Jeffery Gomez "Jeffery Gomez" is a 38 year old Caucasian male, employed, lives in [place], married, has a history of bipolar disorder type II, GAD, ADHD, obstructive sleep apnea noncompliant on CPAP was evaluated in office today for a follow-up appointment.  Patient was last evaluated on 03/12/2024.  He experiences overwhelming anxiety and depressive symptoms, including crying spells that began a day or two ago. Work has become stressful due to increased tasks and pressure, which he usually manages well but now finds overwhelming. He experiences fatigue and lack of energy throughout the day, making simple tasks difficult. He struggles to get going in the morning and feels exhausted despite sleeping well. He mentions a lack of motivation and energy, stating 'I just exist.'  He has been inconsistent with his medication regimen, taking Lexapro  in the morning instead of the evening, and has not been taking Strattera  regularly for the past week or two. He takes Risperdal  at night but has not been using his CPAP machine consistently for the last couple of weeks due to sinus issues. He describes a lack of appetite, forcing himself to eat in the afternoons.  He has a history of anxiety and panic attacks, with difficulty making decisions and communicating. He feels 'dyslexic' and struggles to find words, which is a new symptom for him. He describes feeling like he is losing control of his mind, similar to a previous episode triggered by Adderall. He reports muscle spasms in his legs after lying down but that started couple of days ago.  He has not been drinking  enough water hence unknown if this is due to dehydration versus side effect of risperidone .  He agrees to keep monitoring these symptoms.  He has been using propranolol  to manage feelings of being overwhelmed, which calms him but may contribute to his depressive symptoms. He fears losing his job due to his current condition, despite having worked there for thirteen years without previous episodes.  Denies suicidal thoughts but mentions thoughts of not wanting to be' present '. He feels exhausted despite using the CPAP machine and describes his mornings as the worst part of the day. No significant improvement in energy levels even when using the CPAP regularly.  Collateral information obtained from spouse Ms. Margy Shin at [phone].  As per wife she has noticed a change in his mood including crying spells in the past few days.  She believes he is under a lot of stress at work which could be contributing to this.  He also gets like this when his dad or brother goes through problems with their mental health, both of them have a diagnosis of bipolar disorder.  However currently his dad and brother are doing well and hence does not believe that is a stressor.  Agrees to make sure he takes his medications regularly since she is aware he is not consistent with medication regimen.  Agrees that he may need to take some time off from work since work is a definite stressor.  Agrees to monitor him closely and get him help by going to the nearest emergency department or urgent care.  Visit Diagnosis:    ICD-10-CM   1. Generalized anxiety disorder  F41.1     2. Bipolar 2 disorder, major depressive episode (HCC)  F31.81 risperiDONE  (RISPERDAL ) 0.5 MG tablet   Severe    3. Attention deficit hyperactivity disorder (ADHD), combined type  F90.2     4. 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 Tobacco use disorder  F17.200       Past Psychiatric History: I have reviewed past psychiatric history from progress  note on 04/24/2022.  Past trials of medications like BuSpar -side effect, Depakote-side effect, Lexapro , Zyprexa, Vyvanse , Adderall-psychosis/suicidality.  Past Medical History:  Past Medical History:  Diagnosis Date   Anxiety    Bipolar disorder (HCC)    Panic attacks     Past Surgical History:  Procedure Laterality Date   NO PAST SURGERIES      Family Psychiatric History: I have reviewed family psychiatric history from progress note on 04/24/2022.  Family History:  Family History  Problem Relation Age of Onset   Anxiety disorder Mother    Panic disorder Mother    Bipolar disorder Father    Schizophrenia Father    Alcohol abuse Brother    Bipolar disorder Brother    ADD / ADHD Brother    Anxiety disorder Brother    Bipolar disorder Maternal Aunt    Alcohol abuse Maternal Grandfather    Cirrhosis Maternal Grandfather    Heart attack Maternal Grandmother    Bipolar disorder Paternal Grandmother    Thyroid disease Paternal Grandmother     Social History: I have reviewed social history from progress note on 04/24/2022. Social History   Socioeconomic History   Marital status: Married    Spouse name: Chelsie   Number of children: 3   Years of education: 11 TH GRADE   Highest education level: Not on file  Occupational History   Not on file  Tobacco Use   Smoking status: Former    Types: Cigarettes   Smokeless tobacco: Current    Types: Chew   Tobacco comments:    1 pack per week--03/20/2022  Vaping Use   Vaping status: Never Used  Substance and Sexual Activity   Alcohol use: Yes    Comment: occasinal   Drug use: No   Sexual activity: Yes    Partners: Female  Other Topics Concern   Not on file  Social History Narrative   Works at [employer]   Social Drivers of [employer] Strain: Low Risk  (03/31/2023)   Overall Financial Resource Strain (CARDIA)    Difficulty of Paying Living Expenses: Not hard at all  Food Insecurity: No Food Insecurity (03/31/2023)    Hunger Vital Sign    Worried About Running Out of Food in the Last Year: Never true    Ran Out of Food in the Last Year: Never true  Transportation Needs: No Transportation Needs (03/31/2023)   PRAPARE - Administrator, Civil Service (Medical): No    Lack of Transportation (Non-Medical): No  Physical Activity: Insufficiently Active (03/31/2023)   Exercise Vital Sign    Days of Exercise per Week: 3 days    Minutes of Exercise per Session: 30 min  Stress: No Stress Concern Present (03/31/2023)   [employer] of Occupational Health - Occupational Stress Questionnaire    Feeling of Stress : Only a little  Social Connections: Moderately Integrated (03/31/2023)   Social Connection and Isolation Panel [NHANES]    Frequency of Communication  with Friends and Family: More than three times a week    Frequency of Social Gatherings with Friends and Family: More than three times a week    Attends Religious Services: 1 to 4 times per year    Active Member of [employer] or Organizations: No    Attends Banker Meetings: Never    Marital Status: Married    Allergies:  Allergies  Allergen Reactions   Adderall [Amphetamine -Dextroamphetamine] Other (See Comments)    Crying , paranoia , suicidality   Depakote [Divalproex Sodium] Anaphylaxis    Metabolic Disorder Labs: Lab Results  Component Value Date   HGBA1C 5.9 (H) 03/31/2023   MPG 123 03/31/2023   Lab Results  Component Value Date   PROLACTIN 15.5 12/03/2023   Lab Results  Component Value Date   CHOL 158 03/31/2023   TRIG 83 03/31/2023   HDL 40 03/31/2023   CHOLHDL 4.0 03/31/2023   LDLCALC 101 (H) 03/31/2023   Lab Results  Component Value Date   TSH 1.12 03/19/2022   TSH 1.03 12/30/2017    Therapeutic Level Labs: No results found for: "LITHIUM" No results found for: "VALPROATE" No results found for: "CBMZ"  Current Medications: Current Outpatient Medications  Medication Sig Dispense Refill    atomoxetine  (STRATTERA ) 18 MG capsule Take 1 capsule (18 mg total) by mouth daily. 90 capsule 0   Azelastine HCl 137 MCG/SPRAY SOLN      clobetasol (TEMOVATE) 0.05 % external solution SMARTSIG:Sparingly Topical Twice Daily     escitalopram  (LEXAPRO ) 5 MG tablet TAKE 1 TABLET (5 MG TOTAL) BY MOUTH DAILY. 90 tablet 1   fluticasone  (FLONASE ) 50 MCG/ACT nasal spray Place 2 sprays into both nostrils daily. 16 g 6   propranolol  (INDERAL ) 20 MG tablet TAKE 1 TABLET (20 MG TOTAL) BY MOUTH 2 (TWO) TIMES DAILY AS NEEDED. FOR ANXIETY 180 tablet 1   risperiDONE  (RISPERDAL ) 0.5 MG tablet Take 1 tablet (0.5 mg total) by mouth at bedtime. 30 tablet 1   No current facility-administered medications for this visit.     Musculoskeletal: Strength & Muscle Tone: within normal limits Gait & Station: normal Patient leans: N/A  Psychiatric Specialty Exam: Review of Systems  Psychiatric/Behavioral:  Positive for decreased concentration, dysphoric mood and sleep disturbance. The patient is nervous/anxious.     Blood pressure 117/78, pulse 67, temperature 98.2 F (36.8 C), temperature source Temporal, height 5\' 9"  (1.753 m), weight 163 lb (73.9 kg).Body mass index is 24.07 kg/m.  General Appearance: Casual  Eye Contact:  Minimal  Speech:  Clear and Coherent  Volume:  Normal  Mood:  Anxious and Depressed  Affect:  Tearful  Thought Process:  Goal Directed and Descriptions of Associations: Intact  Orientation:  Full (Time, Place, and Person)  Thought Content: Rumination   Suicidal Thoughts:  No  Homicidal Thoughts:  No  Memory:  Immediate;   Fair Recent;   Fair Remote;   Fair  Judgement:  Fair  Insight:  Fair  Psychomotor Activity:  Normal  Concentration:  Concentration: Poor and Attention Span: Poor  Recall:  limited  Fund of Knowledge: Fair  Language: Fair  Akathisia:  No  Handed:  Right  AIMS (if indicated): done  Assets:  Communication Skills Desire for Improvement Housing Social  Support Transportation  ADL's:  Intact  Cognition: WNL  Sleep:  excessive , has been noncompliant with CPAP the past 2 weeks   Screenings: AIMS    Flowsheet Row Office Visit from 04/14/2024 in [facility] Office Visit from 12/02/2023 in [facility]  AIMS Total Score 0 0      GAD-7    Flowsheet Row Office Visit from 04/14/2024 in [facility] Office Visit from 12/02/2023 in [facility] Office Visit from 11/17/2023 in [facility] Counselor from 10/13/2023 in [facility] Video Visit from 09/26/2023 in [facility]  Total GAD-7 Score 17 8 21 15 17       PHQ2-9    Flowsheet Row Office Visit from 04/14/2024 in [facility] Office Visit from 12/02/2023 in [facility] Office Visit from 11/17/2023 in [facility] Counselor from 10/13/2023 in [facility] Office Visit from 08/26/2023 in [facility]  PHQ-2 Total Score 6 2 4 2 2   PHQ-9 Total Score 25 9 19 12 7       Flowsheet Row Office Visit from 04/14/2024 in [facility] Video Visit from 03/12/2024 in [facility] Video Visit from 01/02/2024 in [facility]  C-SSRS RISK CATEGORY Low Risk No Risk No Risk        Assessment and Plan: CAMILA NORVILLE is a 38 year old Caucasian male with history of bipolar disorder, generalized anxiety disorder, ADHD was evaluated in office today.  Discussed assessment and plan as noted below.  Bipolar disorder type  II-major depressive episode, severe-unstable Currently reports worsening depression symptoms, crying spells, inability to focus since the past few days.  Has been noncompliant with medications like Strattera  as well as CPAP for obstructive sleep apnea which likely also contributing to symptoms including fatigue during the day and lack of motivation. - Increase Risperidone  to 0.5 mg at bedtime. ( EKG -QTc 422, normal sinus rhythm dated Mar 18, 2024) - Will refer for partial hospitalization program I have referred patient to [facility].  Generalized anxiety disorder-unstable Currently reports feeling overwhelmed and anxious since the past few days. - Advised to change Lexapro  5 mg every afternoon to avoid fatigue during the day. - Continue Propranolol  20 mg twice a day as needed however advised to limit use to avoid side effect of depression/low energy.  ADHD-unstable Currently noncompliant on Strattera  and has been struggling with low motivation, forgetfulness. - Encouraged compliance with Strattera  18 mg daily.  Will consider increasing the dosage in the future. - Discussed trial of low dose stimulant in the future again however patient with previous side effects to Adderall currently not interested.  Obstructive sleep apnea-unstable Patient has not been compliant with CPAP therapy and hence currently facing insurance coverage problem. - Encourage consistent use of CPAP to target fatigue, concentration problems.    Collaboration of Care: Collaboration of Care: Other collateral information obtained from wife as noted above.  I have referred this patient for [facility] program. Send referral to [facility] health.  Patient/Guardian was advised Release of Information must be obtained prior to any record release in order to collaborate their care with an outside provider. Patient/Guardian was advised if they have not already done so to contact the registration department to sign all  necessary forms in order for us  to release information regarding their care.   Consent: Patient/Guardian gives verbal consent for treatment and assignment of benefits for services provided during this visit.  Patient/Guardian expressed understanding and agreed to proceed.   I have spent atleast 40 minutes face to face with patient today which includes the time spent for preparing to see the patient ( e.g., review of test, records ), obtaining and to review and separately obtained history , ordering medications ,psychoeducation and supportive psychotherapy and care coordination,as well as documenting clinical information in electronic health record.  This note was generated in part or whole with voice recognition software. Voice recognition is usually quite accurate but there are transcription errors that can and very often do occur. I apologize for any typographical errors that were not detected and corrected.     Zaley Talley, MD 04/15/2024, 8:18 AM

## 2024-04-14 NOTE — Telephone Encounter (Signed)
 Returned call to wife, Joie Narrow, discussed concerns regarding patient's recent decompensation, inconsistency with taking medications, treatment plan including partial hospitalization program referral as well as possible need for admission if he does not do well.  Crisis plan discussed.  Wife does mention patient likely not compliant with medications as prescribed and she has also noticed some change in his mood in the past few days with recent crying spells.  Bipolar disorder runs in his family which includes his father and brother. Wife agrees to make sure patient gets the right help.  She is going to make sure he takes his medications and maybe get a pillbox today.  Agrees to call back as needed with any questions or concerns.

## 2024-04-14 NOTE — Telephone Encounter (Signed)
 received message that she was returning your call. pt was seen today and net appt 6-2

## 2024-04-16 ENCOUNTER — Telehealth (HOSPITAL_COMMUNITY): Payer: Self-pay | Admitting: Professional

## 2024-04-19 ENCOUNTER — Telehealth (HOSPITAL_COMMUNITY): Payer: Self-pay | Admitting: Licensed Clinical Social Worker

## 2024-04-19 ENCOUNTER — Encounter: Payer: Self-pay | Admitting: Psychiatry

## 2024-04-19 ENCOUNTER — Ambulatory Visit: Admitting: Psychiatry

## 2024-04-19 ENCOUNTER — Other Ambulatory Visit: Payer: Self-pay

## 2024-04-19 VITALS — BP 110/73 | HR 65 | Temp 98.2°F | Ht 69.0 in | Wt 162.2 lb

## 2024-04-19 DIAGNOSIS — F3181 Bipolar II disorder: Secondary | ICD-10-CM | POA: Diagnosis not present

## 2024-04-19 DIAGNOSIS — F411 Generalized anxiety disorder: Secondary | ICD-10-CM

## 2024-04-19 DIAGNOSIS — F902 Attention-deficit hyperactivity disorder, combined type: Secondary | ICD-10-CM | POA: Diagnosis not present

## 2024-04-19 DIAGNOSIS — F1590 Other stimulant use, unspecified, uncomplicated: Secondary | ICD-10-CM | POA: Diagnosis not present

## 2024-04-19 DIAGNOSIS — F17200 Nicotine dependence, unspecified, uncomplicated: Secondary | ICD-10-CM

## 2024-04-19 MED ORDER — LAMOTRIGINE 25 MG PO TABS
ORAL_TABLET | ORAL | 0 refills | Status: DC
Start: 2024-04-19 — End: 2024-05-17

## 2024-04-19 NOTE — Progress Notes (Signed)
 BH MD OP Progress Note  04/19/2024 5:03 PM Jeffery Gomez  MRN:  [id number]  Chief Complaint:  Chief Complaint  Patient presents with   Follow-up   Depression   Anxiety   Medication Refill   Discussed the use of AI scribe software for clinical note transcription with the patient, who gave verbal consent to proceed.  History of Present Illness Jeffery Gomez "Jeffery Gomez" is a 38 year old Caucasian male, employed, lives in [place], married, has a history of bipolar disorder type II, GAD, ADHD, obstructive sleep apnea on CPAP was evaluated in office today for a follow-up appointment.  He feels calmer and progressively better, particularly in the latter part of the day. However, he experiences significant cognitive impairment in the mornings, describing it as ' retardation' where he struggles with processing information and performing simple tasks. His mood is better than last week, but the cognitive issues persist.  He is currently taking Risperdal , Lexapro , and Strattera . He takes Risperdal  and Lexapro  at night and Strattera  in the morning. He experiences dry mouth, which he attributes to Strattera , and muscle spasms, which he associates with the increased dose of Risperdal . He has also experienced twitching in his face and legs.  However these are not distressing.  His Risperidone  dosage was recently readjusted and that has not made his twitching worse.  Denies any other side effects.  Believes Risperidone  dosage increase may have helped with his mood symptoms.  He has a history of a psychotic episode triggered by Adderall and has been diagnosed with bipolar type II disorder. He has previously tried Depakote and Zyprexa, which caused side effects. He has also experienced depressive episodes in the past, often triggered by alcohol use, but he is currently abstaining from alcohol.  He has not been using his CPAP machine regularly due to discomfort with the mask, which causes blisters on his  nose. He found a mouthpiece too expensive. He has paid off the CPAP machine and is considering trying a different mask.  He agrees to get in touch with his pulmonologist to discuss this further.  He did not go to work today since he was planning to take FMLA to attend the [facility] program.  However the [facility] program may only begin the week of June 10.  He has an evaluation scheduled for that day.  He does not want to take too many weeks off from work and believes he may be able to go back to work with reduced workload and wait until his June 10 evaluation and could start FMLA once he starts the actual PHP program.  Denies current suicidal thoughts or thoughts of harming others.   Collateral information obtained from [facility], wife who was present in session who reports she has observed some improvement in her spouse on the current medication dosage and she agrees with the fact that he may need to start FMLA once he starts the [facility] program and maybe will benefit from reduced workload until then.    Visit Diagnosis:    ICD-10-CM   1. Generalized anxiety disorder  F41.1     2. Bipolar 2 disorder, major depressive episode (HCC)  F31.81 lamoTRIgine (LAMICTAL) 25 MG tablet   severe    3. Attention deficit hyperactivity disorder (ADHD), combined type  F90.2     4. 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 Tobacco use disorder  F17.200       Past Psychiatric History: I have reviewed past psychiatric history from progress  note on 04/24/2022.  Past trials of medications like BuSpar -side effect, Depakote-side effect, Lexapro , Zyprexa, Vyvanse , Adderall-psychosis/suicidality, carbamazepine.  Past Medical History:  Past Medical History:  Diagnosis Date   Anxiety    Bipolar disorder (HCC)    Panic attacks     Past Surgical History:  Procedure Laterality Date   NO PAST SURGERIES      Family Psychiatric History: I have reviewed family psychiatric history from progress note on 04/24/2022.  Family History:   Family History  Problem Relation Age of Onset   Anxiety disorder Mother    Panic disorder Mother    Bipolar disorder Father    Schizophrenia Father    Alcohol abuse Brother    Bipolar disorder Brother    ADD / ADHD Brother    Anxiety disorder Brother    Bipolar disorder Maternal Aunt    Alcohol abuse Maternal Grandfather    Cirrhosis Maternal Grandfather    Heart attack Maternal Grandmother    Bipolar disorder Paternal Grandmother    Thyroid disease Paternal Grandmother     Social History: I have reviewed social history from progress note on 04/24/2022. Social History   Socioeconomic History   Marital status: Married    Spouse name: Chelsie   Number of children: 3   Years of education: 11 TH GRADE   Highest education level: Not on file  Occupational History   Not on file  Tobacco Use   Smoking status: Former    Types: Cigarettes   Smokeless tobacco: Current    Types: Chew   Tobacco comments:    1 pack per week--03/20/2022  Vaping Use   Vaping status: Never Used  Substance and Sexual Activity   Alcohol use: Yes    Comment: occasinal   Drug use: No   Sexual activity: Yes    Partners: Female  Other Topics Concern   Not on file  Social History Narrative   Works at [employer]   Social Drivers of [employer] Strain: Low Risk  (03/31/2023)   Overall Financial Resource Strain (CARDIA)    Difficulty of Paying Living Expenses: Not hard at all  Food Insecurity: No Food Insecurity (03/31/2023)   Hunger Vital Sign    Worried About Running Out of Food in the Last Year: Never true    Ran Out of Food in the Last Year: Never true  Transportation Needs: No Transportation Needs (03/31/2023)   PRAPARE - Administrator, Civil Service (Medical): No    Lack of Transportation (Non-Medical): No  Physical Activity: Insufficiently Active (03/31/2023)   Exercise Vital Sign    Days of Exercise per Week: 3 days    Minutes of Exercise per Session: 30 min  Stress: No  Stress Concern Present (03/31/2023)   [employer] of Occupational Health - Occupational Stress Questionnaire    Feeling of Stress : Only a little  Social Connections: Moderately Integrated (03/31/2023)   Social Connection and Isolation Panel [NHANES]    Frequency of Communication with Friends and Family: More than three times a week    Frequency of Social Gatherings with Friends and Family: More than three times a week    Attends Religious Services: 1 to 4 times per year    Active Member of [employer] or Organizations: No    Attends Banker Meetings: Never    Marital Status: Married    Allergies:  Allergies  Allergen Reactions   Adderall [Amphetamine -Dextroamphetamine] Other (See Comments)    Crying ,  paranoia , suicidality   Depakote [Divalproex Sodium] Anaphylaxis    Metabolic Disorder Labs: Lab Results  Component Value Date   HGBA1C 5.9 (H) 03/31/2023   MPG 123 03/31/2023   Lab Results  Component Value Date   PROLACTIN 15.5 12/03/2023   Lab Results  Component Value Date   CHOL 158 03/31/2023   TRIG 83 03/31/2023   HDL 40 03/31/2023   CHOLHDL 4.0 03/31/2023   LDLCALC 101 (H) 03/31/2023   Lab Results  Component Value Date   TSH 1.12 03/19/2022   TSH 1.03 12/30/2017    Therapeutic Level Labs: No results found for: "LITHIUM" No results found for: "VALPROATE" No results found for: "CBMZ"  Current Medications: Current Outpatient Medications  Medication Sig Dispense Refill   lamoTRIgine (LAMICTAL) 25 MG tablet Take 1 tablet (25 mg total) by mouth daily for 15 days, THEN 1 tablet (25 mg total) 2 (two) times daily for 15 days. 45 tablet 0   atomoxetine  (STRATTERA ) 18 MG capsule Take 1 capsule (18 mg total) by mouth daily. 90 capsule 0   Azelastine HCl 137 MCG/SPRAY SOLN      clobetasol (TEMOVATE) 0.05 % external solution SMARTSIG:Sparingly Topical Twice Daily     escitalopram  (LEXAPRO ) 5 MG tablet TAKE 1 TABLET (5 MG TOTAL) BY MOUTH DAILY. 90 tablet 1    fluticasone  (FLONASE ) 50 MCG/ACT nasal spray Place 2 sprays into both nostrils daily. 16 g 6   propranolol  (INDERAL ) 20 MG tablet TAKE 1 TABLET (20 MG TOTAL) BY MOUTH 2 (TWO) TIMES DAILY AS NEEDED. FOR ANXIETY 180 tablet 1   risperiDONE  (RISPERDAL ) 0.5 MG tablet Take 1 tablet (0.5 mg total) by mouth at bedtime. 30 tablet 1   No current facility-administered medications for this visit.     Musculoskeletal: Strength & Muscle Tone: within normal limits Gait & Station: normal Patient leans: N/A  Psychiatric Specialty Exam: Review of Systems  Psychiatric/Behavioral:  Positive for decreased concentration and dysphoric mood. The patient is nervous/anxious.     Blood pressure 110/73, pulse 65, temperature 98.2 F (36.8 C), temperature source Temporal, height 5\' 9"  (1.753 m), weight 162 lb 3.2 oz (73.6 kg).Body mass index is 23.95 kg/m.  General Appearance: Casual  Eye Contact:  Fair  Speech:  Slow, does report word finding difficulty  Volume:  Normal  Mood:  Anxious and Depressed  Affect:  Congruent  Thought Process:  Goal Directed and Descriptions of Associations: Intact  Orientation:  Full (Time, Place, and Person)  Thought Content: Logical   Suicidal Thoughts:  No  Homicidal Thoughts:  No  Memory:  Immediate;   Fair Recent;   Fair Remote;   Fair  Judgement:  Fair  Insight:  Fair  Psychomotor Activity:  Decreased  Concentration:  Concentration: limited and Attention Span: limited  Recall:  [employer] of Knowledge: Fair  Language: Fair  Akathisia:  No  Handed:  Right  AIMS (if indicated): done  Assets:  Communication Skills Desire for Improvement Housing Social Support Transportation  ADL's:  Intact  Cognition: WNL  Sleep:  varies, feels not rested in the morning   Screenings: AIMS    Flowsheet Row Office Visit from 04/19/2024 in [facility] Office Visit from 04/14/2024 in [facility]  Office Visit from 12/02/2023 in [facility]  AIMS Total Score 0 0 0      GAD-7    Flowsheet Row Office Visit from 04/14/2024 in [facility] Office Visit from 12/02/2023 in [facility] Office Visit from 11/17/2023 in [facility] Counselor from 10/13/2023 in [facility] Video Visit from 09/26/2023 in [facility]  Total GAD-7 Score 17 8 21 15 17       PHQ2-9    Flowsheet Row Office Visit from 04/14/2024 in [facility] Office Visit from 12/02/2023 in [facility] Office Visit from 11/17/2023 in [facility] Counselor from 10/13/2023 in [facility] Office Visit from 08/26/2023 in [facility]  PHQ-2 Total Score 6 2 4 2 2   PHQ-9 Total Score 25 9 19 12 7       Flowsheet Row Office Visit from 04/19/2024 in [facility] Office Visit from 04/14/2024 in [facility] Video Visit from 03/12/2024 in [facility]  C-SSRS RISK CATEGORY Low Risk Low Risk No Risk        Assessment and Plan: EMILE KYLLO is a 38 year old Caucasian male, has a history of bipolar disorder, GAD, ADHD was evaluated in office today.  Discussed assessment and plan as noted below.  Bipolar disorder type II-major depressive episode, severe-unstable Currently with depression symptoms although crying spells and mood swings have improved.  Continues to have cognitive issues, especially in the morning like inability to find the right words and slowed thinking.  Discussed mood  stabilizer to augment the Risperidone  including Lithium, Lamictal.  Patient would like to start Lamictal.  He may have tried Depakote and Carbamazepine previously and did not tolerate them well. Continue Risperidone  0.5 mg at bedtime ([id number], normal sinus rhythm dated Mar 18, 2024) Start Lamictal 25 mg daily for 2 weeks and increase to 25 mg twice a day after that. Provided medication education discussed side effects like Rogue Clear syndrome, cardiac palpitation and others. Continue Propranolol  20 mg twice a day as needed for severe anxiety, advised to limit use. Continue Lexapro  5 mg with supper advised to take it later on during the day to avoid fatigue during the day.  Generalized anxiety disorder-unstable Continues to feel overwhelmed especially with work-related stressors.  Agreeable to attend PHP program.  Has an evaluation scheduled for June 10. Continue Lexapro  5 mg every afternoon Continue Propranolol  20 mg twice a day as needed. Start Lamictal 25 mg daily for 15 days and increase to 25 mg twice a day after that.  ADHD-unstable Continues to have cognitive issues like word-finding difficulties likely due to current episode of depression. Continue Strattera  18 mg daily  Obstructive sleep apnea-unstable Currently trying to use the CPAP device on a more regular basis although he believes he continues to have problems related to mask.  Agrees to get in touch with his pulmonologist to discuss this. Encouraged compliance with CPAP.   Collateral information obtained from spouse who was present in session as noted above.  I have also coordinated care with PHP program ,patient has upcoming appointment on June 10.  I have printed out a letter for patient for his work to take today off as well as for reduced work load based on discussion during the session today.  Follow-up Follow-up in clinic in 2 weeks or sooner if needed.     Collaboration of Care: Collaboration of Care: Other  patient has been referred to [facility] program,  pending evaluation on June 10.  Crisis plan discussed with patient as well as wife who was present in session.  Patient to go to the nearest emergency department if suicidal or with worsening mood symptoms.  Patient/Guardian was advised Release of Information must be obtained prior to any record release in order to collaborate their care with an outside provider. Patient/Guardian was advised if they have not already done so to contact the registration department to sign all necessary forms in order for us  to release information regarding their care.   Consent: Patient/Guardian gives verbal consent for treatment and assignment of benefits for services provided during this visit. Patient/Guardian expressed understanding and agreed to proceed.  I have spent atleast 40 minutes face to face with patient today which includes the time spent for preparing to see the patient ( e.g., review of test, records ), obtaining and to review and separately obtained history , ordering medications ,printed out a letter for work ,psychoeducation and supportive psychotherapy and care coordination,as well as documenting clinical information in electronic health record  This note was generated in part or whole with voice recognition software. Voice recognition is usually quite accurate but there are transcription errors that can and very often do occur. I apologize for any typographical errors that were not detected and corrected.    Fariha Goto, MD 04/20/2024, 8:55 AM

## 2024-04-19 NOTE — Patient Instructions (Signed)
 Lamotrigine Tablets What is this medication? LAMOTRIGINE (la MOE Patrecia Pace) prevents and controls seizures in people with epilepsy. It may also be used to treat bipolar disorder. It works by calming overactive nerves in your body. This medicine may be used for other purposes; ask your health care provider or pharmacist if you have questions. COMMON BRAND NAME(S): Lamictal, Subvenite What should I tell my care team before I take this medication? They need to know if you have any of these conditions: Heart disease History of irregular heartbeat Immune system problems Kidney disease Liver disease Low levels of folic acid in the blood Lupus Mental health condition Suicidal thoughts, plans, or attempt by you or a family member An unusual or allergic reaction to lamotrigine, other medications, foods, dyes, or preservatives Pregnant or trying to get pregnant Breastfeeding How should I use this medication? Take this medication by mouth with a glass of water. Follow the directions on the prescription label. Do not chew these tablets. If this medication upsets your stomach, take it with food or milk. Take your doses at regular intervals. Do not take your medication more often than directed. A special MedGuide will be given to you by the pharmacist with each new prescription and refill. Be sure to read this information carefully each time. Talk to your care team about the use of this medication in children. While this medication may be prescribed for children as young as 2 years for selected conditions, precautions do apply. Overdosage: If you think you have taken too much of this medicine contact a poison control center or emergency room at once. NOTE: This medicine is only for you. Do not share this medicine with others. What if I miss a dose? If you miss a dose, take it as soon as you can. If it is almost time for your next dose, take only that dose. Do not take double or extra doses. What may  interact with this medication? Atazanavir Certain medications for irregular heartbeat Certain medications for seizures, such as carbamazepine, phenobarbital, phenytoin, primidone, or valproic acid Estrogen or progestin hormones Lopinavir Rifampin Ritonavir This list may not describe all possible interactions. Give your health care provider a list of all the medicines, herbs, non-prescription drugs, or dietary supplements you use. Also tell them if you smoke, drink alcohol, or use illegal drugs. Some items may interact with your medicine. What should I watch for while using this medication? Visit your care team for regular checks on your progress. If you take this medication for seizures, wear a Medic Alert bracelet or necklace. Carry an identification card with information about your condition, medications, and care team. It is important to take this medication exactly as directed. When first starting treatment, your dose will need to be adjusted slowly. It may take weeks or months before your dose is stable. You should contact your care team if your seizures get worse or if you have any new types of seizures. Do not stop taking this medication unless instructed by your care team. Stopping your medication suddenly can increase your seizures or their severity. This medication may cause serious skin reactions. They can happen weeks to months after starting the medication. Contact your care team right away if you notice fevers or flu-like symptoms with a rash. The rash may be red or purple and then turn into blisters or peeling of the skin. You may also notice a red rash with swelling of the face, lips, or lymph nodes in your neck or under your  arms. This medication may affect your coordination, reaction time, or judgment. Do not drive or operate machinery until you know how this medication affects you. Sit up or stand slowly to reduce the risk of dizzy or fainting spells. Drinking alcohol with this  medication can increase the risk of these side effects. If you are taking this medication for bipolar disorder, it is important to report any changes in your mood to your care team. If your condition gets worse, you get mentally depressed, feel very hyperactive or manic, have difficulty sleeping, or have thoughts of hurting yourself or committing suicide, you need to get help from your care team right away. If you are a caregiver for someone taking this medication for bipolar disorder, you should also report these behavioral changes right away. The use of this medication may increase the chance of suicidal thoughts or actions. Pay special attention to how you are responding while on this medication. Your mouth may get dry. Chewing sugarless gum or sucking hard candy and drinking plenty of water may help. Contact your care team if the problem does not go away or is severe. If you become pregnant while using this medication, you may enroll in the [place] American Antiepileptic Drug Pregnancy Registry by calling [phone]. This registry collects information about the safety of antiepileptic medication use during pregnancy. This medication may cause a decrease in folic acid. You should make sure that you get enough folic acid while you are taking this medication. Discuss the foods you eat and the vitamins you take with your care team. What side effects may I notice from receiving this medication? Side effects that you should report to your care team as soon as possible: Allergic reactions--skin rash, itching, hives, swelling of the face, lips, tongue, or throat Change in vision Fever, neck pain or stiffness, sensitivity to light, headache, nausea, vomiting, confusion, which may be signs of meningitis Fever, rash, swollen lymph nodes, confusion, trouble walking, loss of balance or coordination, seizures Heart rhythm changes--fast or irregular heartbeat, dizziness, feeling faint or lightheaded, chest pain,  trouble breathing Infection--fever, chills, cough, or sore throat Low red blood cell level--unusual weakness or fatigue, dizziness, headache, trouble breathing Rash, fever, and swollen lymph nodes Redness, blistering, peeling, or loosening of the skin, including inside the mouth Thoughts of suicide or self-harm, worsening mood, feelings of depression Unusual bruising or bleeding Side effects that usually do not require medical attention (report these to your care team if they continue or are bothersome): Diarrhea Dizziness Drowsiness Headache Nausea Stomach pain Tremors or shaking This list may not describe all possible side effects. Call your doctor for medical advice about side effects. You may report side effects to FDA at 1-800-FDA-1088. Where should I keep my medication? Keep out of the reach of children and pets. Store at [employer] C (77 degrees F). Protect from light. Get rid of any unused medication after the expiration date. To get rid of medications that are no longer needed or have expired: Take the medication to a medication take-back program. Check with your pharmacy or law enforcement to find a location. If you cannot return the medication, check the label or package insert to see if the medication should be thrown out in the garbage or flushed down the toilet. If you are not sure, ask your care team. If it is safe to put it in the trash, empty the medication out of the container. Mix the medication with cat litter, dirt, coffee grounds, or other unwanted substance.  Seal the mixture in a bag or container. Put it in the trash. NOTE: This sheet is a summary. It may not cover all possible information. If you have questions about this medicine, talk to your doctor, pharmacist, or health care provider.  2024 Elsevier/Gold Standard (2023-10-17 00:00:00)

## 2024-04-19 NOTE — Telephone Encounter (Signed)
 Cln returned call and scheduled pt for next available CCA, 6/10 at 10 am. Cln informed pt that FMLA paperwork is typically completed starting from the assessment date. Pt states he has already started his leave. Cln sent secure chat to Dr. Tere Felts to ask if she would be willing to bridge pt's leave since his assessment is delayed. Cln advised pt to f/u with [employer] later today.

## 2024-04-27 ENCOUNTER — Other Ambulatory Visit (HOSPITAL_COMMUNITY): Attending: Psychiatry | Admitting: Licensed Clinical Social Worker

## 2024-04-27 ENCOUNTER — Telehealth (HOSPITAL_COMMUNITY): Payer: Self-pay | Admitting: Psychiatry

## 2024-04-27 DIAGNOSIS — F411 Generalized anxiety disorder: Secondary | ICD-10-CM | POA: Insufficient documentation

## 2024-04-27 DIAGNOSIS — F902 Attention-deficit hyperactivity disorder, combined type: Secondary | ICD-10-CM | POA: Insufficient documentation

## 2024-04-27 DIAGNOSIS — F17200 Nicotine dependence, unspecified, uncomplicated: Secondary | ICD-10-CM | POA: Insufficient documentation

## 2024-04-27 DIAGNOSIS — F3181 Bipolar II disorder: Secondary | ICD-10-CM | POA: Insufficient documentation

## 2024-04-27 NOTE — Telephone Encounter (Signed)
 D:  Pt returned the MH-IOP case mgr's call.  A:  Oriented pt.  Answered all his questions.  He wanted to know the difference between [facility] and MH-IOP.  Reports he travels a lot with his job.  Will be out two days next week; therefore cm will start pt on 05-10-24 @ 9 a.m.  Encouraged pt to verify his insurance benefits.  Inform Claudia.  R:  Pt receptive.

## 2024-04-27 NOTE — Telephone Encounter (Signed)
 D:  Ronda Cocks, LCSW referred pt to virtual MH-IOP.  States pt didn't need PHP level of care.  A:  Placed call to orient pt, but there was no answer.  Voicemail box was full, so message wasn't left.  Inform Claudia.
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
## 2024-05-06 ENCOUNTER — Other Ambulatory Visit: Payer: Self-pay | Admitting: Psychiatry

## 2024-05-06 DIAGNOSIS — F3181 Bipolar II disorder: Secondary | ICD-10-CM

## 2024-05-10 ENCOUNTER — Other Ambulatory Visit (HOSPITAL_COMMUNITY): Attending: Psychiatry | Admitting: Psychiatry

## 2024-05-10 ENCOUNTER — Encounter (HOSPITAL_COMMUNITY): Payer: Self-pay | Admitting: Psychiatry

## 2024-05-10 ENCOUNTER — Encounter (HOSPITAL_COMMUNITY): Payer: Self-pay

## 2024-05-10 DIAGNOSIS — F411 Generalized anxiety disorder: Secondary | ICD-10-CM | POA: Insufficient documentation

## 2024-05-10 DIAGNOSIS — F3181 Bipolar II disorder: Secondary | ICD-10-CM | POA: Insufficient documentation

## 2024-05-10 NOTE — Progress Notes (Signed)
 Psychiatric Initial Adult Assessment   Virtual Visit via Video Note   I connected with Jeffery Gomez on 05/10/2024, 10:30 AM by a video enabled telemedicine application and verified that I am speaking with the correct person using two identifiers.   Location: Patient: Home Provider: Clinic   I discussed the limitations of evaluation and management by telemedicine and the availability of in person appointments. The patient expressed understanding and agreed to proceed.   Follow Up Instructions:   I discussed the assessment and treatment plan with the patient. The patient was provided an opportunity to ask questions and all were answered. The patient agreed with the plan and demonstrated an understanding of the instructions.   The patient was advised to call back or seek an in-person evaluation if the symptoms worsen or if the condition fails to improve as anticipated.  Karleen Kaufmann, MD PGY-3   Patient Identification: Jeffery Gomez MRN:  [id number] Date of Evaluation:  05/10/2024 Referral Source: Partial hospitalization program Chief Complaint:  initial evaluation  Visit Diagnosis:    ICD-10-CM   1. Bipolar 2 disorder, major depressive episode (HCC)  F31.81     2. Generalized anxiety disorder  F41.1       History of Present Illness:  The patient, who goes by Jeffery Gomez, is a 38 year old male who follows with Dr. Eapen as an outpatient.  Primary diagnoses have been GAD, bipolar 2, and ADHD.  The patient came to the intensive outpatient program for help with depression.  The patient reports experiencing severe depression over the past several months.  He reports significant brain fog.  He had little motivation to do things and was ruminating frequently.  Fortunately, the patient reports significant improvement with the addition of lamotrigine  a few weeks ago.  He feels that even after taking a few days of the medication he felt significantly better.  For the past 2 weeks he  reports a predominantly euthymic mood.  He reports consistently sleeping about 8 hours a night.  He reports a good appetite.  He denies experiencing any thoughts of self-harm.  Based on questioning and observation, the patient does not appear to have a psychotic spectrum illness.  Minimal to no PTSD symptomatology.  Bipolar disorder symptomatology per outpatient psychiatrist.  Past Psychiatric History:  2 lifetime psychiatric hospitalizations, last occurring in 2010 for depression.  Substance Use History: Denies  Past Medical History: Sleep apnea  Family Psychiatric History:  None pertinent  Social History:  Married, with 4 children, works in a Insurance account manager role for [employer].  Previous Psychotropic Medications: yes  Substance Abuse History in the last 12 months:  no  Consequences of Substance Abuse: NA  Past Medical History:  Past Medical History:  Diagnosis Date   Anxiety    Bipolar disorder (HCC)    Panic attacks     Past Surgical History:  Procedure Laterality Date   NO PAST SURGERIES      Family History:  Family History  Problem Relation Age of Onset   Anxiety disorder Mother    Panic disorder Mother    Bipolar disorder Father    Schizophrenia Father    Alcohol abuse Brother    Bipolar disorder Brother    ADD / ADHD Brother    Anxiety disorder Brother    Bipolar disorder Maternal Aunt    Alcohol abuse Maternal Grandfather    Cirrhosis Maternal Grandfather    Heart attack Maternal Grandmother    Bipolar disorder Paternal Grandmother  Thyroid disease Paternal Grandmother     Social History:   Social History   Socioeconomic History   Marital status: Married    Spouse name: Chelsie   Number of children: 3   Years of education: 11 TH GRADE   Highest education level: Not on file  Occupational History   Not on file  Tobacco Use   Smoking status: Former    Types: Cigarettes   Smokeless tobacco: Current    Types: Chew   Tobacco comments:     1 pack per week--03/20/2022  Vaping Use   Vaping status: Never Used  Substance and Sexual Activity   Alcohol use: Yes    Comment: occasinal   Drug use: No   Sexual activity: Yes    Partners: Female  Other Topics Concern   Not on file  Social History Narrative   Works at [employer]   Social Drivers of [employer] Strain: Low Risk  (03/31/2023)   Overall Financial Resource Strain (CARDIA)    Difficulty of Paying Living Expenses: Not hard at all  Food Insecurity: No Food Insecurity (03/31/2023)   Hunger Vital Sign    Worried About Running Out of Food in the Last Year: Never true    Ran Out of Food in the Last Year: Never true  Transportation Needs: No Transportation Needs (03/31/2023)   PRAPARE - Administrator, Civil Service (Medical): No    Lack of Transportation (Non-Medical): No  Physical Activity: Insufficiently Active (03/31/2023)   Exercise Vital Sign    Days of Exercise per Week: 3 days    Minutes of Exercise per Session: 30 min  Stress: No Stress Concern Present (03/31/2023)   [employer] of Occupational Health - Occupational Stress Questionnaire    Feeling of Stress : Only a little  Social Connections: Moderately Integrated (03/31/2023)   Social Connection and Isolation Panel    Frequency of Communication with Friends and Family: More than three times a week    Frequency of Social Gatherings with Friends and Family: More than three times a week    Attends Religious Services: 1 to 4 times per year    Active Member of [employer] or Organizations: No    Attends Banker Meetings: Never    Marital Status: Married   Allergies:   Allergies  Allergen Reactions   Adderall [Amphetamine -Dextroamphetamine] Other (See Comments)    Crying , paranoia , suicidality   Depakote [Divalproex Sodium] Anaphylaxis    Metabolic Disorder Labs: Lab Results  Component Value Date   HGBA1C 5.9 (H) 03/31/2023   MPG 123 03/31/2023   Lab Results   Component Value Date   PROLACTIN 15.5 12/03/2023   Lab Results  Component Value Date   CHOL 158 03/31/2023   TRIG 83 03/31/2023   HDL 40 03/31/2023   CHOLHDL 4.0 03/31/2023   LDLCALC 101 (H) 03/31/2023   Lab Results  Component Value Date   TSH 1.12 03/19/2022    Therapeutic Level Labs: No results found for: LITHIUM No results found for: CBMZ No results found for: VALPROATE  Current Medications: Current Outpatient Medications  Medication Sig Dispense Refill   atomoxetine  (STRATTERA ) 18 MG capsule Take 1 capsule (18 mg total) by mouth daily. 90 capsule 0   Azelastine HCl 137 MCG/SPRAY SOLN      clobetasol (TEMOVATE) 0.05 % external solution SMARTSIG:Sparingly Topical Twice Daily     escitalopram  (LEXAPRO ) 5 MG tablet TAKE 1 TABLET (5 MG TOTAL) BY MOUTH DAILY.  90 tablet 1   fluticasone  (FLONASE ) 50 MCG/ACT nasal spray Place 2 sprays into both nostrils daily. 16 g 6   lamoTRIgine  (LAMICTAL ) 25 MG tablet Take 1 tablet (25 mg total) by mouth daily for 15 days, THEN 1 tablet (25 mg total) 2 (two) times daily for 15 days. 45 tablet 0   propranolol  (INDERAL ) 20 MG tablet TAKE 1 TABLET (20 MG TOTAL) BY MOUTH 2 (TWO) TIMES DAILY AS NEEDED. FOR ANXIETY 180 tablet 1   risperiDONE  (RISPERDAL ) 0.5 MG tablet TAKE 1 TABLET BY MOUTH AT BEDTIME. 90 tablet 0   No current facility-administered medications for this visit.   VIRTUAL VISIT, LIMITED ASSESSMENT   Psychiatric Specialty Exam: Physical Exam Constitutional:      Appearance: the patient is not toxic-appearing.  Pulmonary:     Effort: Pulmonary effort is normal.  Neurological:     General: No focal deficit present.     Mental Status: the patient is alert and oriented to person, place, and time.   Review of Systems  Respiratory:  Negative for shortness of breath.   Cardiovascular:  Negative for chest pain.  Gastrointestinal:  Negative for abdominal pain, constipation, diarrhea, nausea and vomiting.  Neurological:  Negative  for headaches.      No vital signs  General Appearance: Fairly Groomed  Eye Contact:  Good  Speech:  Clear and Coherent  Volume:  Normal  Mood:  Euthymic  Affect:  Congruent  Thought Process:  Coherent  Orientation:  Full (Time, Place, and Person)  Thought Content: Logical   Suicidal Thoughts:  No  Homicidal Thoughts:  No  Memory:  Immediate;   Good  Judgement:  fair  Insight:  fair  Psychomotor Activity:  Normal  Concentration:  Concentration: Good  Recall:  Good  Fund of Knowledge: Good  Language: Good  Akathisia:  No  Handed:  not assessed  AIMS (if indicated): not done  Assets:  Communication Skills Desire for Improvement Financial Resources/Insurance Housing Leisure Time Physical Health  ADL's:  Intact  Cognition: WNL        Screenings: Geneticist, molecular Office Visit from 04/19/2024 in [facility] Office Visit from 04/14/2024 in [facility] Office Visit from 12/02/2023 in [facility]  AIMS Total Score 0 0 0   GAD-7    Flowsheet Row Counselor from 04/27/2024 in [facility] Office Visit from 04/14/2024 in [facility] Office Visit from 12/02/2023 in [facility] Office Visit from 11/17/2023 in [facility] Counselor from 10/13/2023 in [facility]  Total GAD-7 Score 19 17 8 21 15    PHQ2-9    Flowsheet Row Counselor from 04/27/2024 in [facility]PITAL[facility] PRO[facility] Office Visit from 04/14/2024 in [facility] Office Visit from 12/02/2023 in [facility] Office Visit from 11/17/2023 in [facility] Counselor from  10/13/2023 in [facility]  PHQ-2 Total Score 6 6 2 4 2   PHQ-9 Total Score 19 25 9 19 12    Flowsheet Row Counselor from 04/27/2024 in [facility] PART[facility]OSPITALIZATION PROGRAM Office Visit from 04/19/2024 in [facility] Office Visit from 04/14/2024 in [facility]  C-SSRS RISK CATEGORY No Risk Low Risk Low Risk  Assessment and Plan:  Primary diagnosis is bipolar affective disorder 2, major depressive episode - Continue lamotrigine  25 mg twice daily, as prescribed by outpatient psychiatrist - Continue other medications as prescribed: Lexapro  5 mg daily, risperidone  0.5 mg nightly, Strattera  18 mg daily - Patient not taking propranolol  anymore, removed from medication list   Collaboration of Care: none  Patient/Guardian was advised Release of Information must be obtained prior to any record release in order to collaborate their care with an outside provider. Patient/Guardian was advised if they have not already done so to contact the registration department to sign all necessary forms in order for us  to release information regarding their care.   Consent: Patient/Guardian gives verbal consent for treatment and assignment of benefits for services provided during this visit. Patient/Guardian expressed understanding and agreed to proceed.   Karleen Kaufmann, MD PGY-3

## 2024-05-10 NOTE — Progress Notes (Signed)
 Virtual Visit via Video Note   I connected with Jeffery Gomez, who prefers to go by Jeffery Gomez" on 05/10/24 at  9:00 AM EDT by a video enabled telemedicine application and verified that I am speaking with the correct person using two identifiers.   At orientation to the IOP program, Case Manager discussed the limitations of evaluation and management by telemedicine and the availability of in person appointments. The patient expressed understanding and agreed to proceed with virtual visits throughout the duration of the program.   Location:  Patient: Patient Home Provider: [facility]   History of Present Illness: Bipolar II Disorder and GAD    Observations/Objective: Check In: Case Manager checked in with all participants to review discharge dates, insurance authorizations, work-related documents and needs from the treatment team regarding medications. Adina stated needs and engaged in discussion.    Initial Therapeutic Activity: Counselor facilitated a check-in with Jeffery Gomez to assess for safety, sobriety and medication compliance.  Counselor also inquired about Jeffery Gomez's current emotional ratings, as well as any significant changes in thoughts, feelings or behavior since previous check in.  Jeffery Gomez presented for session on time and was alert, oriented x5, with no evidence or self-report of active SI/HI or A/V H.  Jeffery Gomez reported compliance with medication and denied use of alcohol or illicit substances.  Jeffery Gomez reported scores of 3/10 for depression, 3/10 for anxiety, and 0/10 for anger/irritability.  Jeffery Gomez denied any recent outbursts or panic attacks.  Jeffery Gomez reported that a struggle has been dealing with symptoms of depression, bipolar disorder, and ADHD.  Jeffery Gomez reported that a success was noticing that his medication appears to be improving stability day to day, as he has been able to get out of bed more easily and has more energy.  Jeffery Gomez reported that his goal today is to go to work after group.           Second Therapeutic Activity: Counselor introduced Jeffery Gomez, [facility] Chaplain to provide psychoeducation on topic of Grief and Loss with members today.  Jeffery Gomez began discussion by checking in with the group about their baseline mood today, general thoughts on what grief means to them and how it has affected them personally in the past.  Jeffery Gomez provided information on how the process of grief/loss can differ depending upon one's unique culture, and categories of loss one could experience (i.e. loss of a person, animal, relationship, job, identity, etc).  Jeffery Gomez encouraged members to be mindful of how pervasive loss can be, and how to recognize signs which could indicate that this is having an impact on one's overall mental health and wellbeing.  Intervention effectiveness was mixed, as client did answer an 'icebreaker' question posed by chaplain, but didn't participate in discussion on topic of grief.    Third Therapeutic Activity: Counselor provided psychoeducation on subject of boundaries with group members today using a virtual handout.  This handout defined boundaries as the limits and rules that we set for ourselves within relationships, and featured a breakdown of the 3 common categories of boundaries (i.e. porous, rigid, and healthy), along with typical traits specific to each one for easy identification.  It was noted that most people have a mixture of different boundary types depending on setting, person, and culture.  Additional information was provided on the types of boundaries (i.e. physical, intellectual, emotional, sexual, material, and time) within relationships, and what could be considered healthy versus unhealthy. Counselor tasked members with identifying what types of boundaries they presently hold within  her own support systems, the collective impact these boundaries have upon their mental health, and changes that could be made in order to more effectively communicate individual mental  health needs.  Intervention was effective, as evidenced by [facility] actively engaging in discussion on topic, reporting that he has porous boundaries due to traits such as having trouble saying "No" to others, dependent on the opinions of others, and fearing rejection if he does not comply with others requests.  Jeffery Gomez reported that this has been a problem due to getting overinvolved in others people's problems and losing focus on his own needs.  He stated "I didn't take time for myself and I was always try to please them".  He reported that his openness has also been a problem before in settings like work when being too open about his mental health and the fact that he takes medication.  Jeffery Gomez reported that he would work to improve boundaries by working to develop more trust before opening up about certain topics with people, and dedicate more time in his schedule toward self-care activities, stating "If you can't take care of yourself then you can't take care of others".    Assessment and Plan: Counselor recommends that Jeffery Gomez remain in IOP treatment to better manage mental health symptoms, ensure stability and pursue completion of treatment plan goals. Counselor recommends adherence to crisis/safety plan, taking medications as prescribed, and following up with medical professionals if any issues arise.    Follow Up Instructions: Counselor will send [employer] Teams link for session tomorrow.  Adina was advised to call back or seek an in-person evaluation if the symptoms worsen or if the condition fails to improve as anticipated.   Collaboration of Care:   Medication Management AEB Staci Kerns, NP or Dr. Karleen Kaufmann                                          Case Manager AEB Ricka Gaskins, CNA    Patient/Guardian was advised Release of Information must be obtained prior to any record release in order to collaborate their care with an outside provider. Patient/Guardian was advised if they have not already done so  to contact the registration department to sign all necessary forms in order for us  to release information regarding their care.    Consent: Patient/Guardian gives verbal consent for treatment and assignment of benefits for services provided during this visit. Patient/Guardian expressed understanding and agreed to proceed.   I provided 180 minutes of non-face-to-face time during this encounter.   Darleene Ricker, LCSW, LCAS 05/10/24

## 2024-05-11 ENCOUNTER — Other Ambulatory Visit (HOSPITAL_COMMUNITY): Admitting: Psychiatry

## 2024-05-11 DIAGNOSIS — F3181 Bipolar II disorder: Secondary | ICD-10-CM | POA: Diagnosis not present

## 2024-05-11 DIAGNOSIS — F411 Generalized anxiety disorder: Secondary | ICD-10-CM

## 2024-05-11 NOTE — Progress Notes (Signed)
 Virtual Visit via Video Note   I connected with Jeffery Gomez, who prefer "Jeffery Gomez" on 05/11/24 at  9:00 AM EDT by a video enabled telemedicine application and verified that I am speaking with the correct person using two identifiers.   At orientation to the IOP program, Case Manager discussed the limitations of evaluation and management by telemedicine and the availability of in person appointments. The patient expressed understanding and agreed to proceed with virtual visits throughout the duration of the program.   Location:  Patient: Patient Home Provider: [facility]   History of Present Illness: Bipolar II Disorder and GAD   Observations/Objective: Check In: Case Manager checked in with all participants to review discharge dates, insurance authorizations, work-related documents and needs from the treatment team regarding medications. Jeffery Gomez stated needs and engaged in discussion.    Initial Therapeutic Activity: Counselor facilitated a check-in with Jeffery Gomez to assess for safety, sobriety and medication compliance.  Counselor also inquired about Jeffery Gomez's current emotional ratings, as well as any significant changes in thoughts, feelings or behavior since previous check in.  Jeffery Gomez presented for session on time and was alert, oriented x5, with no evidence or self-report of active SI/HI or A/V H.  Jeffery Gomez reported compliance with medication and denied use of alcohol or illicit substances.  Jeffery Gomez reported scores of 2/10 for depression, 4/10 for anxiety, and 0/10 for anger/irritability.  Jeffery Gomez denied any recent outbursts or panic attacks.  Jeffery Gomez denied any new struggles.  Jeffery Gomez reported that a success was noticing improved mood after group yesterday, working a shift at his job, and then cooking a nice meal for the family in the evening.  Jeffery Gomez reported that his goal today is to go to work after group, and try not to stress himself out.          Second Therapeutic Activity: Counselor engaged the group in  discussion on managing work/life balance today to improve mental health and wellness.  Counselor explained how finding balance between responsibilities at home and work place can be challenging, and lead to increased stress.  Counselor facilitated discussion on what challenges members are currently, or have historically faced.  Counselor also discussed strategies for improving work/life balance while members work on their mental health during treatment.  Some of these included keeping track of time management; creating a list of priorities and scaling importance; setting realistic, measurable goals each day; establishing boundaries; taking care of health needs; and nurturing relationships at home and work for support.  Counselor inquired about areas where members feel they are excelling, as well as areas they could focus on during treatment. Intervention was effective, as evidenced by Jeffery Gomez actively participating in discussion on topic and reporting that he oftentimes feels 'time poor' due to the demands of work, noting that when he comes home from the job he is already tired, and then has to clean up, cook, and tend to the children.  He stated "There is no time to wind down, I'm just going, going, going".  Jeffery Gomez reported that he experienced several symptoms of burnout, including feeling tired and drained, lowered immunity, changing sleep, lack of motivation, feeling overwhelmed, and procrastination.  Jeffery Gomez reported that there have also been numerous warning signs such as getting out of shape, feeling tired a lot, skipping breaks, and pulling out of social engagements.  Jeffery Gomez was receptive to suggestions offered today for addressing work life imbalance, including setting a boundary with his phone to avoid ruminating on work communications outside of work  hours, spending more quality time with his family, and thinking about future career opportunities to explore such as starting his own business.    Assessment and  Plan: Counselor recommends that Jeffery Gomez remain in IOP treatment to better manage mental health symptoms, ensure stability and pursue completion of treatment plan goals. Counselor recommends adherence to crisis/safety plan, taking medications as prescribed, and following up with medical professionals if any issues arise.    Follow Up Instructions: Counselor will send [employer] Teams link for session tomorrow.  Jeffery Gomez was advised to call back or seek an in-person evaluation if the symptoms worsen or if the condition fails to improve as anticipated.   Collaboration of Care:   Medication Management AEB Staci Kerns, NP or Dr. Karleen Kaufmann                                          Case Manager AEB Ricka Gaskins, CNA    Patient/Guardian was advised Release of Information must be obtained prior to any record release in order to collaborate their care with an outside provider. Patient/Guardian was advised if they have not already done so to contact the registration department to sign all necessary forms in order for us  to release information regarding their care.    Consent: Patient/Guardian gives verbal consent for treatment and assignment of benefits for services provided during this visit. Patient/Guardian expressed understanding and agreed to proceed.   I provided 180 minutes of non-face-to-face time during this encounter.   Darleene Ricker, LCSW, LCAS 05/11/24

## 2024-05-12 ENCOUNTER — Other Ambulatory Visit (HOSPITAL_COMMUNITY)

## 2024-05-12 DIAGNOSIS — F411 Generalized anxiety disorder: Secondary | ICD-10-CM

## 2024-05-12 DIAGNOSIS — F3181 Bipolar II disorder: Secondary | ICD-10-CM

## 2024-05-12 NOTE — Progress Notes (Signed)
 Virtual Visit via Video Note   I connected with Jeffery Gomez, who prefer "Jeffery Gomez" on 05/12/24 at  9:00 AM EDT by a video enabled telemedicine application and verified that I am speaking with the correct person using two identifiers.   At orientation to the IOP program, Case Manager discussed the limitations of evaluation and management by telemedicine and the availability of in person appointments. The patient expressed understanding and agreed to proceed with virtual visits throughout the duration of the program.   Location:  Patient: Patient Home Provider: [facility]   History of Present Illness: Bipolar II Disorder and GAD   Observations/Objective: Check In: Case Manager checked in with all participants to review discharge dates, insurance authorizations, work-related documents and needs from the treatment team regarding medications. Jeffery Gomez stated needs and engaged in discussion.    Initial Therapeutic Activity: Counselor facilitated a check-in with Jeffery Gomez to assess for safety, sobriety and medication compliance.  Counselor also inquired about Jeffery Gomez's current emotional ratings, as well as any significant changes in thoughts, feelings or behavior since previous check in.  Jeffery Gomez presented for session on time and was alert, oriented x5, with no evidence or self-report of active SI/HI or A/V H.  Jeffery Gomez reported compliance with medication and denied use of alcohol or illicit substances.  Jeffery Gomez reported scores of 2/10 for depression, 4/10 for anxiety, and 3/10 for irritability.  Jeffery Gomez denied any recent outbursts or panic attacks.  Jeffery Gomez reported that a struggle was feeling more irritable this morning due to lack of sleep.  He reported that his daughter won't sleep throughout the night, so this has been difficult for him.  Jeffery Gomez reported that a success was working at his job yesterday, mowing the lawn afterward, and then having a nice meal with his family.  Jeffery Gomez reported that his goal today is to set aside  time for self-care to improve his mood.          Second Therapeutic Activity: Counselor introduced topic of assertive communication today.  Counselor shared various handouts with members virtually in group to read along with on the subject.  These handouts defined assertive communication as a communication style in which a person stands up for their own needs and wants, while also taking into consideration the needs and wants of others, without behaving in a passive or aggressive way.  Traits of assertive communicators were highlighted such as using appropriate speaking volume, maintaining eye contact, using confident language, and avoiding interruption.  Members were also provided with tips on how to improve communication, including respecting oneself, expressing thoughts and feelings calmly, and saying "No" when necessary.  Members were given a variety of scenarios where they could practice using these tips to respond in an assertive manner.  Intervention was effective, as evidenced by [facility] participating in discussion on topic, reporting that he has a passive communication style due to traits such as allowing others to take advantage, prioritizing the needs of others, and not expressing his own needs.  Jeffery Gomez reported that this has led to issues such as enabling his brother's bad behavior, which impacted both finances and career direction at times.  Jeffery Gomez showed more effective use of assertive communication skills through engagement in roleplay activities, stating "I need to be more assertive".    Assessment and Plan: Counselor recommends that Jeffery Gomez remain in IOP treatment to better manage mental health symptoms, ensure stability and pursue completion of treatment plan goals. Counselor recommends adherence to crisis/safety plan, taking medications as prescribed, and  following up with medical professionals if any issues arise.    Follow Up Instructions: Counselor will send [employer] Teams link for session  tomorrow.  Jeffery Gomez was advised to call back or seek an in-person evaluation if the symptoms worsen or if the condition fails to improve as anticipated.   Collaboration of Care:   Medication Management AEB Staci Kerns, NP or Dr. Karleen Kaufmann                                          Case Manager AEB Ricka Gaskins, CNA    Patient/Guardian was advised Release of Information must be obtained prior to any record release in order to collaborate their care with an outside provider. Patient/Guardian was advised if they have not already done so to contact the registration department to sign all necessary forms in order for us  to release information regarding their care.    Consent: Patient/Guardian gives verbal consent for treatment and assignment of benefits for services provided during this visit. Patient/Guardian expressed understanding and agreed to proceed.   I provided 180 minutes of non-face-to-face time during this encounter.   Darleene Ricker, LCSW, LCAS 05/12/24

## 2024-05-13 ENCOUNTER — Other Ambulatory Visit (HOSPITAL_COMMUNITY)

## 2024-05-13 DIAGNOSIS — F411 Generalized anxiety disorder: Secondary | ICD-10-CM | POA: Diagnosis not present

## 2024-05-13 DIAGNOSIS — F3181 Bipolar II disorder: Secondary | ICD-10-CM | POA: Diagnosis not present

## 2024-05-13 NOTE — Progress Notes (Signed)
 Virtual Visit via Video Note   I connected with Jeffery Gomez, who prefer "Jeffery Gomez" on 05/13/24 at  9:00 AM EDT by a video enabled telemedicine application and verified that I am speaking with the correct person using two identifiers.   At orientation to the IOP program, Case Manager discussed the limitations of evaluation and management by telemedicine and the availability of in person appointments. The patient expressed understanding and agreed to proceed with virtual visits throughout the duration of the program.   Location:  Patient: Patient Home Provider: Home Office   History of Present Illness: Bipolar II Disorder and GAD   Observations/Objective: Check In: Case Manager checked in with all participants to review discharge dates, insurance authorizations, work-related documents and needs from the treatment team regarding medications. Adina stated needs and engaged in discussion.    Initial Therapeutic Activity: Counselor facilitated a check-in with Jeffery Gomez to assess for safety, sobriety and medication compliance.  Counselor also inquired about Jeffery Gomez's current emotional ratings, as well as any significant changes in thoughts, feelings or behavior since previous check in.  Jeffery Gomez presented for session on time and was alert, oriented x5, with no evidence or self-report of active SI/HI or A/V H.  Jeffery Gomez reported compliance with medication and denied use of alcohol or illicit substances.  Jeffery Gomez reported scores of 2/10 for depression, 2/10 for anxiety, and 0/10 for anger/irritability.  Jeffery Gomez denied any recent outbursts or panic attacks.  Jeffery Gomez reported that a struggle has been feeling lazy due to time taken out for therapy.  Jeffery Gomez reported that a success was having a good day yesterday despite this, including getting better sleep at night.  Jeffery Gomez reported that his goal today is to go to his job after group, and then do some cleaning around the home with his wife once he gets home.          Second Therapeutic  Activity: Counselor introduced topic of self-care today.  Counselor explained how this can be defined as the things one does to maintain good health and improve well-being.  Counselor provided members with a self-care assessment form to complete.  This handout featured various sub-categories of self-care, including physical, psychological/emotional, social, spiritual, and professional.  Members were asked to rank their engagement in the activities listed for each dimension on a scale of 1-3, with 1 indicating 'Poor', 2 indicating 'Ok', and 3 indicating 'Well'.  Counselor invited members to share results of their assessment, and inquired about which areas of self-care they are doing well in, as well as areas that require attention, and how they plan to begin addressing this during treatment.  Intervention was effective, as evidenced by [facility] successfully completing initial 2 sections of assessment and actively engaging in discussion on subject, reporting that he is excelling in areas such as maintaining personal hygiene, resting when sick, getting away from work distraction, finding reasons to laugh, and talking about his problems, but would benefit from focusing more on areas such as participating in fun activities, and exercise.  Jeffery Gomez reported that he would work to improve self-care deficits by exploring new hobbies such as playing musical instruments, improving exercise by doing more weight training, planning a daytrip somewhere like the beach or mountains with his wife, setting a boundary with work communications to Administrator, arts, and finding a therapist to meet with regularly to ensure a safe outlet to talk about struggles and gain feedback.    Assessment and Plan: Counselor recommends that [facility] remain in IOP treatment to better manage  mental health symptoms, ensure stability and pursue completion of treatment plan goals. Counselor recommends adherence to crisis/safety plan, taking medications as  prescribed, and following up with medical professionals if any issues arise.    Follow Up Instructions: Counselor will send [employer] Teams link for session tomorrow.  Adina was advised to call back or seek an in-person evaluation if the symptoms worsen or if the condition fails to improve as anticipated.   Collaboration of Care:   Medication Management AEB Staci Kerns, NP or Dr. Karleen Kaufmann                                          Case Manager AEB Ricka Gaskins, CNA    Patient/Guardian was advised Release of Information must be obtained prior to any record release in order to collaborate their care with an outside provider. Patient/Guardian was advised if they have not already done so to contact the registration department to sign all necessary forms in order for us  to release information regarding their care.    Consent: Patient/Guardian gives verbal consent for treatment and assignment of benefits for services provided during this visit. Patient/Guardian expressed understanding and agreed to proceed.   I provided 180 minutes of non-face-to-face time during this encounter.   Darleene Ricker, LCSW, LCAS 05/13/24

## 2024-05-14 ENCOUNTER — Telehealth (HOSPITAL_COMMUNITY): Payer: Self-pay | Admitting: Psychiatry

## 2024-05-14 ENCOUNTER — Other Ambulatory Visit (HOSPITAL_COMMUNITY): Admitting: Psychiatry

## 2024-05-14 NOTE — Telephone Encounter (Signed)
 D:  Pt informed the MH-IOP Case Mgr that he wouldn't be attending group today d/t an appt.  A:  Excuse pt from group today.  Inform treatment team.  R:  Pt receptive.

## 2024-05-15 ENCOUNTER — Other Ambulatory Visit: Payer: Self-pay | Admitting: Psychiatry

## 2024-05-15 DIAGNOSIS — F3181 Bipolar II disorder: Secondary | ICD-10-CM

## 2024-05-17 ENCOUNTER — Other Ambulatory Visit (HOSPITAL_COMMUNITY): Admitting: Psychiatry

## 2024-05-17 DIAGNOSIS — F3181 Bipolar II disorder: Secondary | ICD-10-CM

## 2024-05-17 DIAGNOSIS — F411 Generalized anxiety disorder: Secondary | ICD-10-CM

## 2024-05-17 MED ORDER — LAMOTRIGINE 25 MG PO TABS
25.0000 mg | ORAL_TABLET | Freq: Two times a day (BID) | ORAL | 0 refills | Status: DC
Start: 2024-05-17 — End: 2024-06-18

## 2024-05-17 MED ORDER — ESCITALOPRAM OXALATE 5 MG PO TABS
5.0000 mg | ORAL_TABLET | Freq: Every day | ORAL | 0 refills | Status: DC
Start: 2024-05-17 — End: 2024-08-19

## 2024-05-17 NOTE — Progress Notes (Signed)
 Virtual Visit via Video Note   I connected with Jeffery Gomez. Hagood, who prefer "Jeffery Gomez" on 05/17/24 at  9:00 AM EDT by a video enabled telemedicine application and verified that I am speaking with the correct person using two identifiers.   At orientation to the IOP program, Case Manager discussed the limitations of evaluation and management by telemedicine and the availability of in person appointments. The patient expressed understanding and agreed to proceed with virtual visits throughout the duration of the program.   Location:  Patient: Patient Home Provider: [facility]   History of Present Illness: Bipolar II Disorder and GAD   Observations/Objective: Check In: Case Manager checked in with all participants to review discharge dates, insurance authorizations, work-related documents and needs from the treatment team regarding medications. Adina stated needs and engaged in discussion.    Initial Therapeutic Activity: Counselor facilitated a check-in with Jeffery Gomez to assess for safety, sobriety and medication compliance.  Counselor also inquired about Jeffery Gomez's current emotional ratings, as well as any significant changes in thoughts, feelings or behavior since previous check in.  Jeffery Gomez presented for session on time and was alert, oriented x5, with no evidence or self-report of active SI/HI or A/V H.  Jeffery Gomez reported compliance with medication and denied use of alcohol or illicit substances.  Jeffery Gomez reported scores of 1/10 for depression, 5/10 for anxiety, and 0/10 for anger/irritability.  Jeffery Gomez denied any recent outbursts or panic attacks.  Jeffery Gomez reported that a struggle was realizing that he only has a few days left of medication.  Counselor informed case manager to assist with outreaching provider.  Jeffery Gomez reported that a success was having a relatively stress free weekend spent with family cooking out and swimming.  Jeffery Gomez reported that his goal today is to try to find a healthy work life balance since he  received many calls about problems at work early this morning.          Second Therapeutic Activity: Counselor introduced topic of stress management today.  Counselor provided definition of stress as feeling tense, overwhelmed, worn out, and/or exhausted, and noted that in small amounts, stress can be motivating until things become too overwhelming to manage.  Counselor also explained how stress can be acute (brief but intense) or chronic (long-lasting) and this can impact the severity of symptoms one can experience in the physical, emotional, and behavioral categories.  Counselor inquired about members' specific stressors, how long they have been prevalent, and the various symptoms that tend to manifest as a result.  Counselor also offered several stress management strategies to help improve members' coping ability, including journaling, gratitude practice, relaxation techniques, and time management tips.  Counselor also explained that research has shown a strong support network composed of trusted family, friends, or community members can increase resilience in times of stress, and inquired about who members can reach out to for help in managing stressors.  Counselor encouraged members to consider discussing stressor 'red flags' with their close supports that can be monitored and strategies for assisting them in times of crisis.  Intervention effectiveness was mixed, as evidenced by [facility] participating in discussion on subject, and reporting that when he is overwhelmed with stress related to his job or personal life, he will either go into 'fight or flight' mode, stating "Its like my brain shuts down".  He reported that his stress management goal is to increase daily exercise by walking and stretching 10 minutes a day.  Jeffery Gomez also engaged in deep breathing exercise today  and reported that he did feel more relaxed, stating "It was like a reset".  Jeffery Gomez asked to leave group at breaktime in order to manage a problem  with his job that was distracting him, and was unable to engage in second half of group as a result.     Assessment and Plan: Counselor recommends that Jeffery Gomez remain in IOP treatment to better manage mental health symptoms, ensure stability and pursue completion of treatment plan goals. Counselor recommends adherence to crisis/safety plan, taking medications as prescribed, and following up with medical professionals if any issues arise.    Follow Up Instructions: Counselor will send [employer] Teams link for session tomorrow.  Adina was advised to call back or seek an in-person evaluation if the symptoms worsen or if the condition fails to improve as anticipated.   Collaboration of Care:   Medication Management AEB Staci Kerns, NP or Dr. Karleen Kaufmann                                          Case Manager AEB Ricka Gaskins, CNA    Patient/Guardian was advised Release of Information must be obtained prior to any record release in order to collaborate their care with an outside provider. Patient/Guardian was advised if they have not already done so to contact the registration department to sign all necessary forms in order for us  to release information regarding their care.    Consent: Patient/Guardian gives verbal consent for treatment and assignment of benefits for services provided during this visit. Patient/Guardian expressed understanding and agreed to proceed.   I provided 90 minutes of non-face-to-face time during this encounter.   Darleene Ricker, LCSW, LCAS 05/17/24

## 2024-05-17 NOTE — Progress Notes (Signed)
 Medication refills sent in per request.

## 2024-05-18 ENCOUNTER — Other Ambulatory Visit (HOSPITAL_COMMUNITY): Admitting: Psychiatry

## 2024-05-18 ENCOUNTER — Telehealth (HOSPITAL_COMMUNITY): Payer: Self-pay | Admitting: Psychiatry

## 2024-05-18 NOTE — Telephone Encounter (Signed)
 D:  Pt excused today from virtual MH-IOP; states he had difficulty logging on.  Pt is requesting 05-20-24 to be his last day.  It's hard for me to juggle work and group.  They know I am in a program but they keep calling me.  Pt is requesting a therapist.  A:  Provided pt with support.  Encouraged him to f/u with Dr. Senora office re: therapist.  Inform treatment team.  R:  Pt receptive.

## 2024-05-19 ENCOUNTER — Encounter (HOSPITAL_COMMUNITY): Payer: Self-pay | Admitting: Family

## 2024-05-19 ENCOUNTER — Other Ambulatory Visit (HOSPITAL_COMMUNITY): Attending: Psychiatry | Admitting: Psychiatry

## 2024-05-19 DIAGNOSIS — F3181 Bipolar II disorder: Secondary | ICD-10-CM | POA: Insufficient documentation

## 2024-05-19 DIAGNOSIS — F411 Generalized anxiety disorder: Secondary | ICD-10-CM | POA: Insufficient documentation

## 2024-05-19 NOTE — Progress Notes (Signed)
 Virtual Visit via Telephone Note  I connected with Jeffery Gomez on 05/19/24 at  9:00 AM EDT by telephone and verified that I am speaking with the correct person using two identifiers.  Location: Patient: Home Provider: Office   I discussed the limitations, risks, security and privacy concerns of performing an evaluation and management service by telephone and the availability of in person appointments. I also discussed with the patient that there may be a patient responsible charge related to this service. The patient expressed understanding and agreed to proceed.     I discussed the assessment and treatment plan with the patient. The patient was provided an opportunity to ask questions and all were answered. The patient agreed with the plan and demonstrated an understanding of the instructions.   The patient was advised to call back or seek an in-person evaluation if the symptoms worsen or if the condition fails to improve as anticipated.  I provided 15 minutes of non-face-to-face time during this encounter.   Jeffery LOISE Kerns, NP   [facility] Intensive Outpatient Program Discharge Summary  Jeffery Gomez [id number]  Admission date: 05/10/2024 Discharge date: 05/20/2024  Reason for admission: Per admission  assessment  note; The patient, who goes by Jeffery Gomez, is a 38 year old male who follows with Dr. Eapen as an outpatient. Primary diagnoses have been GAD, bipolar 2, and ADHD. The patient came to the intensive outpatient program for help with depression.    Progress in Program Toward Treatment Goals: Progressing patient attended and participated with daily group session with active and engaged participation.  Denies any suicidal or homicidal ideations.  Denies auditory visual hallucination.  Reports he recently received a medication refill from previous provider.  States he already has outpatient follow-up with psychiatry services and has resources for therapy  services to follow-up.  No concerns related to sleep or appetite disturbance.  States overall the group has been very informative and he likes the after visit summary and worksheets as provided by group facilitator. no other concerns noted at this visit  Progress (rationale): Take all of you medications as prescribed by your mental healthcare provider.  Report any adverse effects and reactions from your medications to your outpatient provider promptly.  Do not engage in alcohol and or illegal drug use while on prescription medicines. Keep all scheduled appointments. This is to ensure that you are getting refills on time and to avoid any interruption in your medication.  If you are unable to keep an appointment call to reschedule.  Be sure to follow up with resources and follow ups given. In the event of worsening symptoms call the crisis hotline, 911, and or go to the nearest emergency department for appropriate evaluation and treatment of symptoms. Follow-up with your primary care provider for your medical issues, concerns and or health care needs.    Collaboration of Care: Psychiatrist AEB Eappen 06/11/2024, Patient to folllow-up with outpatient providers  Patient/Guardian was advised Release of Information must be obtained prior to any record release in order to collaborate their care with an outside provider. Patient/Guardian was advised if they have not already done so to contact the registration department to sign all necessary forms in order for us  to release information regarding their care.   Consent: Patient/Guardian gives verbal consent for treatment and assignment of benefits for services provided during this visit. Patient/Guardian expressed understanding and agreed to proceed.    Jeffery Kerns NP  05/19/2024

## 2024-05-19 NOTE — Progress Notes (Signed)
 Virtual Visit via Video Note   I connected with Jeffery Gomez. Terlizzi, who prefer "Jeffery Gomez" on 05/19/24 at  9:00 AM EDT by a video enabled telemedicine application and verified that I am speaking with the correct person using two identifiers.   At orientation to the IOP program, Case Manager discussed the limitations of evaluation and management by telemedicine and the availability of in person appointments. The patient expressed understanding and agreed to proceed with virtual visits throughout the duration of the program.   Location:  Patient: Patient Home Provider: [facility] Office   History of Present Illness: Bipolar II Disorder and GAD   Observations/Objective: Check In: Case Manager checked in with all participants to review discharge dates, insurance authorizations, work-related documents and needs from the treatment team regarding medications. Adina stated needs and engaged in discussion.    Initial Therapeutic Activity: Counselor facilitated a check-in with Jeffery Gomez to assess for safety, sobriety and medication compliance.  Counselor also inquired about Jeffery Gomez's current emotional ratings, as well as any significant changes in thoughts, feelings or behavior since previous check in.  Jeffery Gomez presented for session on time and was alert, oriented x5, with no evidence or self-report of active SI/HI or A/V H.  Jeffery Gomez reported compliance with medication and denied use of alcohol or illicit substances.  Jeffery Gomez reported scores of 3/10 for depression, 3/10 for anxiety, and 2/10 for irritability.  Jeffery Gomez denied any recent outbursts or panic attacks.  Jeffery Gomez reported that a struggle was missing group yesterday due to running late.  Jeffery Gomez reported that a success was getting some goals accomplished at his job.  Jeffery Gomez reported that his goal today is to go to work again after group, and then doing some household chores.            Second Therapeutic Activity: Counselor introduced Buel Cedar, [facility] Pharmacist, to provide  psychoeducation on topic of medication compliance with members today.  Buel provided psychoeducation on classes of medications such as antidepressants, antipsychotics, what symptoms they are intended to treat, and any side effects one might encounter while on a particular prescription.  Time was allowed for clients to ask any questions they might have of [facility] regarding this specialty.  Intervention was effective, as evidenced by [facility] participating in discussion with speaker on the subject, reporting that at nighttime he experiences muscle spasms or tremors and worries about this being associated with medication he is on.  Adina was receptive to feedback from pharmacist on benefits of Jeffery Gomez's current medication regimen, potential side effects that could explain this reaction at bedtime, and alternative approaches to discuss with his provider.    Third Therapeutic Activity: Counselor discussed topic of distress tolerance today with group.  Counselor shared virtual handout with members that offered a DBT approach represented by the acronym ACCEPTS, and outlined strategies for distracting oneself from distressing emotions, allowing appropriate time for these emotions to lesson in intensity and eventually fade away.  Strategies offered included engaging in positive activities, contributing to the wellbeing of others, comparing one's present situation to a previously difficult one to highlight resilience, using mental imagery, and physical grounding.  Counselor assisted members in creating their own realistic ACCEPTS plan for tackling a distressing emotion of their choice.  Intervention was effective, as evidenced by [facility] participating in creation of the plan, choosing fearful as his emotion of focus, and identifying several helpful approaches for distraction, such as doing a home project, asking his wife how her day went, reflecting on the past and challenges  he has overcome before, thinking about people who don't have  the same resources or abilities that he does to inspire gratitude, taking a walk outside, visualizing a peaceful image in his mind like relaxing on the beach, or going outside and counting objects around him.    Assessment and Plan: Counselor recommends that Jeffery Gomez remain in IOP treatment to better manage mental health symptoms, ensure stability and pursue completion of treatment plan goals. Counselor recommends adherence to crisis/safety plan, taking medications as prescribed, and following up with medical professionals if any issues arise.    Follow Up Instructions: Counselor will send [employer] Teams link for session tomorrow.  Adina was advised to call back or seek an in-person evaluation if the symptoms worsen or if the condition fails to improve as anticipated.   Collaboration of Care:   Medication Management AEB Staci Kerns, NP                                           Case Manager AEB Ricka Gaskins, CNA    Patient/Guardian was advised Release of Information must be obtained prior to any record release in order to collaborate their care with an outside provider. Patient/Guardian was advised if they have not already done so to contact the registration department to sign all necessary forms in order for us  to release information regarding their care.    Consent: Patient/Guardian gives verbal consent for treatment and assignment of benefits for services provided during this visit. Patient/Guardian expressed understanding and agreed to proceed.   I provided 180 minutes of non-face-to-face time during this encounter.   Darleene Ricker, LCSW, LCAS 05/19/24

## 2024-05-20 ENCOUNTER — Encounter (HOSPITAL_COMMUNITY): Payer: Self-pay | Admitting: Psychiatry

## 2024-05-20 ENCOUNTER — Other Ambulatory Visit (HOSPITAL_COMMUNITY): Admitting: Psychiatry

## 2024-05-20 DIAGNOSIS — F3181 Bipolar II disorder: Secondary | ICD-10-CM

## 2024-05-20 DIAGNOSIS — F411 Generalized anxiety disorder: Secondary | ICD-10-CM

## 2024-05-20 NOTE — Progress Notes (Signed)
 Virtual Visit via Video Note  I connected with Jeffery Gomez on @TODAY @ at  9:00 AM EDT by a video enabled telemedicine application and verified that I am speaking with the correct person using two identifiers.  Location: Patient: at home Provider: at office   I discussed the limitations of evaluation and management by telemedicine and the availability of in person appointments. The patient expressed understanding and agreed to proceed.  I discussed the assessment and treatment plan with the patient. The patient was provided an opportunity to ask questions and all were answered. The patient agreed with the plan and demonstrated an understanding of the instructions.   The patient was advised to call back or seek an in-person evaluation if the symptoms worsen or if the condition fails to improve as anticipated.  I provided 20 minutes of non-face-to-face time during this encounter.   GRETTA LEVAN, M.Ed,CNA   Patient ID: Jeffery Gomez, male   DOB: December 01, 1985, 38 y.o.   MRN: [id number] D:  As per previous CCA states:  After starting Adderall, severe depressive episodes started again. Diagnosed with bipolar 2. Started having severe depressive episodes in high school. Was started on Lexapro  and it immediately improved. Continued to have them, they all happened after drinking alcohol and stressful situations. States each depressive episode gradually got worse. Did not have a severe episode for 7 years, then started to have anxiety around 2017/2018 and was started back on Lexapro .      ADLs: normal     Tx Hx: med man, one therapy appointment     Hospitalizations: 2 for major depressive episodes, the last one being in 2010     Attempts:      Dx: ADHD     SI/HI/AVH: denies all. States he may have had "a little touch of psychosis" when he first started Adderall.     Self-harm: denies     Family Hx: father has "bipolar and schizophrenia" and takes a LAI. An aunt and uncle on mother's side were both  "mentally handicapped."      Supports: mother, wife, father, boss     Living situation: wife, four children (19yo, 17yo, 10yo, and 26yo)     Current/most recent substance use: denies     Substance use history: denies     Medical diagnoses: denies any current     Weapons in the home: Yes. Agrees to have them secured.  Pt only attended seven days out of nine.  Pt is requesting discharge d/t work.  It's hard for me to balance the program and work b/c my employer knows that I am here but they keep calling me during group time. Pt reports that he hasn't incorporated any of the coping skills yet.  Pt c/o still struggling with increased anxiety in the mornings.  On a scale of 1-10 (10 being the worst); pt rates his anxiety at a 5 and depression at a 2.  Denies SI/HI or A/V hallucinations/paranoia.  :Groups were helpful.  It was good being in a room with other people and to realize that I'm not alone. A:  D/C today.  F/U with Dr. Eappen on 06-11-24 @ 11 a.m.SABRA  Pt states he will call his insurance company to inquire about therapists so he can research them.  Strongly recommended support groups through [facility].    Pt was advised of ROI must be obtained prior to any records release in order to collaborate his care with an outside provider.  Pt was  advised if he has not already done so to contact the front desk to sign all necessary forms in order for [facility] to release info re: his care.  Consent:  Pt gives verbal consent for tx and assignment of benefits for services provided during this telehealth group process.  Pt expressed understanding and agreed to proceed. Collaboration of care:  Collaborate with Staci Kerns, NP AEB; Dr. Karleen Kaufmann AEB; Dr. Coby HAIR; and Darleene Ricker, LCSW AEB. R:  Pt receptive.    Ricka Gaskins, M.Ed,CNA

## 2024-05-20 NOTE — Patient Instructions (Signed)
 D:  Patient completed virtual [facility] today.  A:  Discharge today.  Follow up with Dr. Eappen on 06-11-24 @ 11 a.m.  Patient will call his insurance company to inquire about approved providers per his request.  Recommended support groups through [facility]  [phone].  R:  Patient receptive.

## 2024-05-20 NOTE — Progress Notes (Signed)
 Virtual Visit via Video Note   I connected with Jeffery Gomez. Jeffery Gomez, who prefers to go by Jeffery Gomez" on 05/20/24 at 9:00am by video enabled telemedicine application and verified that I am speaking with the correct person using two identifiers.   I discussed the limitations, risks, security and privacy concerns of performing an evaluation and management service by video and the availability of in person appointments. I also discussed with the patient that there may be a patient responsible charge related to this service. The patient expressed understanding and agreed to proceed.   I discussed the assessment and treatment plan with the patient. The patient was provided an opportunity to ask questions and all were answered. The patient agreed with the plan and demonstrated an understanding of the instructions.   The patient was advised to call back or seek an in-person evaluation if the symptoms worsen or if the condition fails to improve as anticipated.   I provided 1 hour of non-face-to-face time during this encounter.     Darleene Ricker, LCSW, LCAS ______________________________ THERAPIST PROGRESS NOTE   Session Time: 9:00am - 10:00am   Location: Patient: Patient Home Provider: Home Office   Participation Level: Active    Behavioral Response: Alert, casually dressed, anxious mood/affect    Type of Therapy:  Individual Therapy   Treatment Goals addressed: Mood management; Medication compliance  Progress Towards Goals: Progressing   Interventions: CBT: grounding techniques   Summary: Jeffery Gomez, who prefers to go by "Jeffery Gomez" is 38 year old married Caucasian male that presented for therapy session today with diagnoses of Bipolar II Disorder and Generalized Anxiety Disorder.       Suicidal/Homicidal: None; without plan or intent.   Therapist Response: Clinician met with Jeffery Gomez. Jeffery Gomez, who prefers to go by Jeffery Gomez" for virtual appointment and assessed for safety, sobriety, and  medication compliance.  Jeffery Gomez was scheduled to attend [facility] today, but due to low census, an individual session was offered instead.  Jeffery Gomez presented for session on time and was alert, oriented x5, with no evidence or self-report of SI/HI or A/V H.  Jeffery Gomez reported ongoing compliance with medication and denied use of alcohol or illicit substances.  Clinician inquired about Jeffery Gomez's current emotional ratings, as well as any significant changes in thoughts, feelings, or behavior since last check-in.  Jeffery Gomez reported scores of 3/10 for depression, 6/10 for anxiety, 0/10 for anger/irritability.  He denied any recent panic attacks or outbursts.  Jeffery Gomez reported that a recent success was going to his job yesterday and having an uneventful day.  He reported that a struggle has been having moments where "Anxiety ramps up at times".  Clinician introduced topic of grounding skills today.  Clinician defined these as simple strategies one can use to help detach from difficult thoughts or feelings temporarily by focusing on something else.  Clinician noted that grounding will not solve the problem at hand, but can provide the practitioner with time to regain control over his thoughts and/or feelings and prevent the situation from getting worse (i.e. interrupting a panic attack).  Clinician divided these into three categories (mental, physical, and soothing) and then provided examples of each which Jeffery Gomez could practice during session.  Some of these included describing one's environment in detail or playing a categories game with oneself for mental category, taking a hot bath/shower, stretching, or carrying a grounding object for physical category, and saying kind statements, or visualizing people one cares about for soothing category.  Clinician inquired about which techniques Jeffery Gomez  has used with success in the past, or will commit to learning, practicing, and applying now to improve coping abilities.  Intervention was effective, as  evidenced by [facility] participating in discussion on the subject, trying out several of the techniques during session, and expressing interest in adding several to his available coping skills, such as describing his environment in detail, playing a categories game in involving listing models of cars, picturing himself on vacation at the beach, looking at 'memes' that make him laugh, counting to 10, splashing his face with cold water, fidgeting with objects on his desk, drumming his fingers, using his wedding ring as a grounding object, stretching, clenching and releasing his fists, engaging in deep breathing, reciting kind statements such as "Its alright and everything will be fine", eating one of his favorite meals, looking at favorite photos of his family, or listening to an uplifting song.  Jeffery Gomez stated "I think these will be very helpful".  Clinician will continue to monitor.     Plan: Jeffery Gomez will be discharged from [facility] today.  He spoke with case manager about discharge plan, and intends to link with an individual therapist soon to ensure ongoing treatment.    Diagnosis: Bipolar II Disorder and Generalized Anxiety Disorder.         Collaboration of Care:   Medication Management AEB Staci Kerns, NP                                           Case Manager AEB Ricka Gaskins, CNA.        Patient/Guardian was advised Release of Information must be obtained prior to any record release in order to collaborate their care with an outside provider. Patient/Guardian was advised if they have not already done so to contact the registration department to sign all necessary forms in order for us  to release information regarding their care.    Consent: Patient/Guardian gives verbal consent for treatment and assignment of benefits for services provided during this visit. Patient/Guardian expressed understanding and agreed to proceed.   Darleene Ricker, LCSW, LCAS 05/20/24

## 2024-05-24 ENCOUNTER — Other Ambulatory Visit (HOSPITAL_COMMUNITY)

## 2024-05-25 ENCOUNTER — Other Ambulatory Visit (HOSPITAL_COMMUNITY)

## 2024-05-26 ENCOUNTER — Other Ambulatory Visit (HOSPITAL_COMMUNITY)

## 2024-05-27 ENCOUNTER — Other Ambulatory Visit (HOSPITAL_COMMUNITY)

## 2024-05-28 ENCOUNTER — Other Ambulatory Visit (HOSPITAL_COMMUNITY)

## 2024-05-31 ENCOUNTER — Other Ambulatory Visit (HOSPITAL_COMMUNITY)

## 2024-06-01 ENCOUNTER — Other Ambulatory Visit (HOSPITAL_COMMUNITY)

## 2024-06-02 ENCOUNTER — Other Ambulatory Visit (HOSPITAL_COMMUNITY)

## 2024-06-03 ENCOUNTER — Other Ambulatory Visit (HOSPITAL_COMMUNITY)

## 2024-06-04 ENCOUNTER — Other Ambulatory Visit (HOSPITAL_COMMUNITY)

## 2024-06-07 ENCOUNTER — Other Ambulatory Visit (HOSPITAL_COMMUNITY)

## 2024-06-08 ENCOUNTER — Other Ambulatory Visit (HOSPITAL_COMMUNITY)

## 2024-06-09 ENCOUNTER — Other Ambulatory Visit (HOSPITAL_COMMUNITY)

## 2024-06-10 ENCOUNTER — Other Ambulatory Visit (HOSPITAL_COMMUNITY)

## 2024-06-11 ENCOUNTER — Telehealth: Admitting: Psychiatry

## 2024-06-18 ENCOUNTER — Encounter: Payer: Self-pay | Admitting: Psychiatry

## 2024-06-18 ENCOUNTER — Telehealth: Admitting: Psychiatry

## 2024-06-18 DIAGNOSIS — F411 Generalized anxiety disorder: Secondary | ICD-10-CM

## 2024-06-18 DIAGNOSIS — F902 Attention-deficit hyperactivity disorder, combined type: Secondary | ICD-10-CM | POA: Diagnosis not present

## 2024-06-18 DIAGNOSIS — F1590 Other stimulant use, unspecified, uncomplicated: Secondary | ICD-10-CM | POA: Diagnosis not present

## 2024-06-18 DIAGNOSIS — F3181 Bipolar II disorder: Secondary | ICD-10-CM | POA: Diagnosis not present

## 2024-06-18 DIAGNOSIS — F17200 Nicotine dependence, unspecified, uncomplicated: Secondary | ICD-10-CM

## 2024-06-18 MED ORDER — LAMOTRIGINE 25 MG PO TABS
75.0000 mg | ORAL_TABLET | Freq: Every day | ORAL | 1 refills | Status: DC
Start: 2024-06-18 — End: 2024-08-19

## 2024-06-18 NOTE — Progress Notes (Signed)
 Virtual Visit via Video Note  I connected with Jeffery Gomez on 06/18/24 at 10:00 AM EDT by a video enabled telemedicine application and verified that I am speaking with the correct person using two identifiers.  Location Provider Location : [facility] Patient Location : Car  Participants: Patient , Provider   I discussed the limitations of evaluation and management by telemedicine and the availability of in person appointments. The patient expressed understanding and agreed to proceed.   I discussed the assessment and treatment plan with the patient. The patient was provided an opportunity to ask questions and all were answered. The patient agreed with the plan and demonstrated an understanding of the instructions.   The patient was advised to call back or seek an in-person evaluation if the symptoms worsen or if the condition fails to improve as anticipated.  BH MD OP Progress Note  06/18/2024 12:08 PM Jeffery Gomez  MRN:  [id number]  Chief Complaint:  Chief Complaint  Patient presents with   Follow-up   Depression   Anxiety   Medication Refill   Discussed the use of AI scribe software for clinical note transcription with the patient, who gave verbal consent to proceed.  History of Present Illness Jeffery Gomez [name] is a 38 year old Caucasian male, employed, lives in [place], married, has a history of bipolar disorder type II, GAD, ADHD, obstructive sleep apnea on CPAP was evaluated by telemedicine today for a follow-up appointment.  He reports ongoing motivational difficulties and describes persistent issues with word-finding and slowed cognitive processing over the past 2 to 3 years. These symptoms sometimes affect his ability to communicate, particularly during small talk, and he finds himself relying on others to help find words. He feels frustrated that he is not as quick-witted as he used to be, and he expresses a desire to return to his previous level of  functioning.  He reports a history of anxiety and disorganization that has persisted for several years. He reports occasional sad thoughts and moments of tearing up during conversations, though he denies experiencing crying spells. He expresses concern about not wanting to feel emotionally blunted or 'zombified' by medication.  He states that he currently takes Lamictal , 50 mg daily , and has not noticed any side effects, specifically monitoring for rash. He also reports taking risperidone  0.5 mg at bedtime and Lexapro , with a prescription ready for pickup. He takes Strattera  for ADHD symptoms at a low dose and has remained adherent to his medication regimen since his last appointment. He remains uncertain about whether Strattera  is making a difference for his ADHD symptoms.  He reports that his sleep is generally good, though he feels groggy in the mornings. He attributes this to his sleep habits, including staying up late to watch movies or use his phone. He reports not using his CPAP machine since the last appointment, citing recent sinus issues as a barrier.  He reports current caffeine use, including daily consumption of 1 energy drink each morning for the past 3 days, following approximately 1 month without energy drinks. He also reports use of soda or coffee. He notes that energy drinks increase his anxiety levels slightly, while soda or coffee do not have this effect.     Visit Diagnosis:    ICD-10-CM   1. Generalized anxiety disorder  F41.1     2. Bipolar 2 disorder, major depressive episode (HCC)  F31.81 lamoTRIgine  (LAMICTAL ) 25 MG tablet   Improving    3. Attention deficit hyperactivity disorder (  ADHD), combined type  F90.2     4. 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 Tobacco use disorder  F17.200       Past Psychiatric History: I have reviewed past psychiatric history from progress note on 04/24/2022.  Past trials of medications-BuSpar -side effect, Depakote-side effect, Lexapro ,  Zyprexa, Vyvanse , Adderall-psychosis/suicidality, carbamazepine  Past Medical History:  Past Medical History:  Diagnosis Date   Anxiety    Bipolar disorder (HCC)    Panic attacks     Past Surgical History:  Procedure Laterality Date   NO PAST SURGERIES      Family Psychiatric History: I have reviewed family psychiatric history from progress note on 04/24/2022.  Family History:  Family History  Problem Relation Age of Onset   Anxiety disorder Mother    Panic disorder Mother    Bipolar disorder Father    Schizophrenia Father    Alcohol abuse Brother    Bipolar disorder Brother    ADD / ADHD Brother    Anxiety disorder Brother    Bipolar disorder Maternal Aunt    Alcohol abuse Maternal Grandfather    Cirrhosis Maternal Grandfather    Heart attack Maternal Grandmother    Bipolar disorder Paternal Grandmother    Thyroid disease Paternal Grandmother     Social History: I have reviewed social history from progress note on 04/24/2022. Social History   Socioeconomic History   Marital status: Married    Spouse name: Chelsie   Number of children: 3   Years of education: 11 TH GRADE   Highest education level: Not on file  Occupational History   Not on file  Tobacco Use   Smoking status: Former    Types: Cigarettes   Smokeless tobacco: Current    Types: Chew   Tobacco comments:    1 pack per week--03/20/2022  Vaping Use   Vaping status: Never Used  Substance and Sexual Activity   Alcohol use: Yes    Comment: occasinal   Drug use: No   Sexual activity: Yes    Partners: Female  Other Topics Concern   Not on file  Social History Narrative   Works at [employer]   Social Drivers of [employer] Strain: Low Risk  (03/31/2023)   Overall Financial Resource Strain (CARDIA)    Difficulty of Paying Living Expenses: Not hard at all  Food Insecurity: No Food Insecurity (03/31/2023)   Hunger Vital Sign    Worried About Running Out of Food in the Last Year: Never true     Ran Out of Food in the Last Year: Never true  Transportation Needs: No Transportation Needs (03/31/2023)   PRAPARE - Administrator, Civil Service (Medical): No    Lack of Transportation (Non-Medical): No  Physical Activity: Insufficiently Active (03/31/2023)   Exercise Vital Sign    Days of Exercise per Week: 3 days    Minutes of Exercise per Session: 30 min  Stress: No Stress Concern Present (03/31/2023)   [employer] of Occupational Health - Occupational Stress Questionnaire    Feeling of Stress : Only a little  Social Connections: Moderately Integrated (03/31/2023)   Social Connection and Isolation Panel    Frequency of Communication with Friends and Family: More than three times a week    Frequency of Social Gatherings with Friends and Family: More than three times a week    Attends Religious Services: 1 to 4 times per year    Active Member of [employer]  or Organizations: No    Attends Banker Meetings: Never    Marital Status: Married    Allergies:  Allergies  Allergen Reactions   Adderall [Amphetamine -Dextroamphetamine] Other (See Comments)    Crying , paranoia , suicidality   Depakote [Divalproex Sodium] Anaphylaxis    Metabolic Disorder Labs: Lab Results  Component Value Date   HGBA1C 5.9 (H) 03/31/2023   MPG 123 03/31/2023   Lab Results  Component Value Date   PROLACTIN 15.5 12/03/2023   Lab Results  Component Value Date   CHOL 158 03/31/2023   TRIG 83 03/31/2023   HDL 40 03/31/2023   CHOLHDL 4.0 03/31/2023   LDLCALC 101 (H) 03/31/2023   Lab Results  Component Value Date   TSH 1.12 03/19/2022   TSH 1.03 12/30/2017    Therapeutic Level Labs: No results found for: LITHIUM No results found for: VALPROATE No results found for: CBMZ  Current Medications: Current Outpatient Medications  Medication Sig Dispense Refill   atomoxetine  (STRATTERA ) 18 MG capsule Take 1 capsule (18 mg total) by mouth daily. 90 capsule 0    Azelastine HCl 137 MCG/SPRAY SOLN      clobetasol (TEMOVATE) 0.05 % external solution SMARTSIG:Sparingly Topical Twice Daily     escitalopram  (LEXAPRO ) 5 MG tablet Take 1 tablet (5 mg total) by mouth daily. 90 tablet 0   fluticasone  (FLONASE ) 50 MCG/ACT nasal spray Place 2 sprays into both nostrils daily. 16 g 6   lamoTRIgine  (LAMICTAL ) 25 MG tablet Take 3 tablets (75 mg total) by mouth daily. 90 tablet 1   risperiDONE  (RISPERDAL ) 0.5 MG tablet TAKE 1 TABLET BY MOUTH AT BEDTIME. 90 tablet 0   No current facility-administered medications for this visit.     Musculoskeletal: Strength & Muscle Tone: UTA Gait & Station: Seated Patient leans: N/A  Psychiatric Specialty Exam: Review of Systems  Psychiatric/Behavioral:  Positive for dysphoric mood and sleep disturbance. The patient is nervous/anxious.     There were no vitals taken for this visit.There is no height or weight on file to calculate BMI.  General Appearance: Casual  Eye Contact:  Fair  Speech:  Clear and Coherent  Volume:  Normal  Mood:  Anxious and Depressed  Affect:  Appropriate  Thought Process:  Goal Directed and Descriptions of Associations: Intact  Orientation:  Full (Time, Place, and Person)  Thought Content: Logical   Suicidal Thoughts:  No  Homicidal Thoughts:  No  Memory:  Immediate;   Fair Recent;   Fair Remote;   Fair  Judgement:  Fair  Insight:  Fair  Psychomotor Activity:  Normal  Concentration:  Concentration: Fair and Attention Span: Fair  Recall:  [employer] of Knowledge: Fair  Language: Fair  Akathisia:  No  Handed:  Right  AIMS (if indicated): not done  Assets:  Communication Skills Desire for Improvement Housing Social Support Talents/Skills Transportation  ADL's:  Intact  Cognition: WNL  Sleep:  varies likely due to lack of sleep hygiene , non compliant on CPAP MASK   Screenings: AIMS    Flowsheet Row Office Visit from 04/19/2024 in [facility]  Office Visit from 04/14/2024 in [facility] Office Visit from 12/02/2023 in [facility]  AIMS Total Score 0 0 0   GAD-7    Flowsheet Row Counselor from 04/27/2024 in BEHAVIORAL HEALTH PARTIAL HOSPITALIZATION PROGRAM Office Visit from 04/14/2024 in [facility] Office Visit from 12/02/2023 in  [facility] Office Visit from 11/17/2023 in [facility] Counselor from 10/13/2023 in [facility]  Total GAD-7 Score 19 17 8 21 15    PHQ2-9    Flowsheet Row Counselor from 05/11/2024 in BEHAVIORAL HEALTH INTENSIVE [facility] Counselor from 04/27/2024 in BEHAVIORAL HEALTH PARTIAL HOSPITALIZATION PROGRAM Office Visit from 04/14/2024 in [facility] Office Visit from 12/02/2023 in [facility] Office Visit from 11/17/2023 in [facility]  PHQ-2 Total Score 2 6 6 2 4   PHQ-9 Total Score 12 19 25 9 19    Flowsheet Row Video Visit from 06/18/2024 in [facility] Counselor from 05/11/2024 in BEHAVIORAL HEALTH INTENSIVE [facility] Counselor from 04/27/2024 in BEHAVIORAL HEALTH PARTIAL HOSPITALIZATION PROGRAM  C-SSRS RISK CATEGORY No Risk Error: Question 2 not populated No Risk     Assessment and Plan: Jeffery Gomez is a 38 year old Caucasian male, has a history of bipolar disorder, GAD, ADHD was evaluated by telemedicine today.  Discussed assessment and plan as noted below.  Bipolar disorder type II major depressive episode severe-improving Currently with ongoing cognitive issues which is bothersome although reports good response to the Lamictal  and notes no side effects.  Long-term plan to taper of risperidone  due to concerns about  long-term side effects. Increase Lamictal  to 75 mg daily in divided dosage Continue Risperidone  0.5 mg at bedtime , plan to taper this medication down in the future. Continue Lexapro  5 mg with supper. Encouraged to to be compliant on CPAP, currently not compliant.  Generalized anxiety disorder-improving Currently reports anxiety symptoms as better although with recent use of caffeine in the last couple of days anxiety has been up.  Has been mindful about cutting back on caffeine and using coping strategies. Continue Lexapro  5 mg daily Continue Propranolol  20 mg twice a day as needed Increase Lamictal  to 75 mg daily  ADHD-unstable Continues to have cognitive issues word-finding difficulties and lack of motivation.  Not sure if Strattera  is beneficial although currently compliant.  Denies side effects. Continue Strattera  18 mg daily Consider dosage increase in the future.  Follow-up Follow-up in clinic in 6 to 8 weeks or sooner if needed.    Consent: Patient/Guardian gives verbal consent for treatment and assignment of benefits for services provided during this visit. Patient/Guardian expressed understanding and agreed to proceed.   This note was generated in part or whole with voice recognition software. Voice recognition is usually quite accurate but there are transcription errors that can and very often do occur. I apologize for any typographical errors that were not detected and corrected.    Alexavier Tsutsui, MD 06/18/2024, 12:08 PM

## 2024-06-19 ENCOUNTER — Telehealth: Admitting: Nurse Practitioner

## 2024-06-19 DIAGNOSIS — B9789 Other viral agents as the cause of diseases classified elsewhere: Secondary | ICD-10-CM

## 2024-06-19 DIAGNOSIS — J019 Acute sinusitis, unspecified: Secondary | ICD-10-CM

## 2024-06-19 MED ORDER — IPRATROPIUM BROMIDE 0.03 % NA SOLN
2.0000 | Freq: Two times a day (BID) | NASAL | 12 refills | Status: AC
Start: 2024-06-19 — End: ?

## 2024-06-19 MED ORDER — PREDNISONE 20 MG PO TABS
20.0000 mg | ORAL_TABLET | Freq: Every day | ORAL | 0 refills | Status: AC
Start: 2024-06-19 — End: 2024-06-24

## 2024-06-19 NOTE — Progress Notes (Signed)
 I have spent 5 minutes in review of e-visit questionnaire, review and updating patient chart, medical decision making and response to patient.   Claiborne Rigg, NP

## 2024-06-19 NOTE — Progress Notes (Signed)
 Jeffery Gomez we typically do not treat for bacterial sinus infections unless you have been sick for at least 7-10 days. At this time we would try a stronger steroid nasal spray such as atrovent  to see if this helps clear your sinuses. I also sent prednisone  since you are having trouble breathing.   E-Visit for Sinus Problems  We are sorry that you are not feeling well.  Here is how we plan to help!  Based on what you have shared with me it looks like you have sinusitis.  Sinusitis is inflammation and infection in the sinus cavities of the head.  Based on your presentation I believe you most likely have Acute Viral Sinusitis.This is an infection most likely caused by a virus. There is not specific treatment for viral sinusitis other than to help you with the symptoms until the infection runs its course.  You may use an oral decongestant such as Mucinex D or if you have glaucoma or high blood pressure use plain Mucinex. Saline nasal spray help and can safely be used as often as needed for congestion, I have prescribed: Ipratropium Bromide  nasal spray 0.03% 2 sprays in eah nostril 2-3 times a day  Some authorities believe that zinc sprays or the use of Echinacea may shorten the course of your symptoms.  Sinus infections are not as easily transmitted as other respiratory infection, however we still recommend that you avoid close contact with loved ones, especially the very young and elderly.  Remember to wash your hands thoroughly throughout the day as this is the number one way to prevent the spread of infection!  Home Care: Only take medications as instructed by your medical team. Do not take these medications with alcohol. A steam or ultrasonic humidifier can help congestion.  You can place a towel over your head and breathe in the steam from hot water coming from a faucet. Avoid close contacts especially the very young and the elderly. Cover your mouth when you cough or sneeze. Always remember to  wash your hands.  Get Help Right Away If: You develop worsening fever or sinus pain. You develop a severe head ache or visual changes. Your symptoms persist after you have completed your treatment plan.  Make sure you Understand these instructions. Will watch your condition. Will get help right away if you are not doing well or get worse.   Thank you for choosing an e-visit.  Your e-visit answers were reviewed by a board certified advanced clinical practitioner to complete your personal care plan. Depending upon the condition, your plan could have included both over the counter or prescription medications.  Please review your pharmacy choice. Make sure the pharmacy is open so you can pick up prescription now. If there is a problem, you may contact your provider through [employer] and have the prescription routed to another pharmacy.  Your safety is important to us . If you have drug allergies check your prescription carefully.   For the next 24 hours you can use MyChart to ask questions about today's visit, request a non-urgent call back, or ask for a work or school excuse. You will get an email in the next two days asking about your experience. I hope that your e-visit has been valuable and will speed your recovery.

## 2024-08-14 ENCOUNTER — Other Ambulatory Visit: Payer: Self-pay | Admitting: Psychiatry

## 2024-08-14 DIAGNOSIS — F3181 Bipolar II disorder: Secondary | ICD-10-CM

## 2024-08-19 ENCOUNTER — Encounter: Payer: Self-pay | Admitting: Psychiatry

## 2024-08-19 ENCOUNTER — Telehealth (INDEPENDENT_AMBULATORY_CARE_PROVIDER_SITE_OTHER): Admitting: Psychiatry

## 2024-08-19 DIAGNOSIS — F902 Attention-deficit hyperactivity disorder, combined type: Secondary | ICD-10-CM | POA: Diagnosis not present

## 2024-08-19 DIAGNOSIS — F1590 Other stimulant use, unspecified, uncomplicated: Secondary | ICD-10-CM

## 2024-08-19 DIAGNOSIS — F411 Generalized anxiety disorder: Secondary | ICD-10-CM | POA: Diagnosis not present

## 2024-08-19 DIAGNOSIS — F3176 Bipolar disorder, in full remission, most recent episode depressed: Secondary | ICD-10-CM | POA: Diagnosis not present

## 2024-08-19 DIAGNOSIS — F17200 Nicotine dependence, unspecified, uncomplicated: Secondary | ICD-10-CM

## 2024-08-19 MED ORDER — LAMOTRIGINE 25 MG PO TABS
75.0000 mg | ORAL_TABLET | Freq: Every day | ORAL | 1 refills | Status: DC
Start: 2024-08-19 — End: 2024-09-22

## 2024-08-19 MED ORDER — RISPERIDONE 0.5 MG PO TABS
0.2500 mg | ORAL_TABLET | Freq: Every day | ORAL | Status: DC
Start: 2024-08-19 — End: 2024-09-22

## 2024-08-19 MED ORDER — ESCITALOPRAM OXALATE 5 MG PO TABS: 5.0000 mg | ORAL_TABLET | Freq: Every day | ORAL | 0 refills | Status: DC

## 2024-08-19 NOTE — Patient Instructions (Signed)
 Please restart Lamictal  at a 25 mg once daily for 1-2 weeks and increase to 25 mg twice daily for another 1-2 weeks and then Lamictal  75 mg daily since you missed the Lamictal  for more than two days.  Once you start Lamictal  75 mg daily , you can reduce Risperidone  to 0.25 mg daily.

## 2024-08-19 NOTE — Progress Notes (Unsigned)
 Virtual Visit via Video Note  I connected with Jeffery Gomez on 08/19/24 at  4:40 PM EDT by a video enabled telemedicine application and verified that I am speaking with the correct person using two identifiers.  Location Provider Location : [facility] Patient Location : Car  Participants: Patient ,Son, Provider   I discussed the limitations of evaluation and management by telemedicine and the availability of in person appointments. The patient expressed understanding and agreed to proceed.   I discussed the assessment and treatment plan with the patient. The patient was provided an opportunity to ask questions and all were answered. The patient agreed with the plan and demonstrated an understanding of the instructions.   The patient was advised to call back or seek an in-person evaluation if the symptoms worsen or if the condition fails to improve as anticipated.   BH MD OP Progress Note  08/19/2024 5:13 PM Jeffery Gomez  MRN:  [id number]  Chief Complaint:  Chief Complaint  Patient presents with   Follow-up   Depression   Anxiety   Medication Refill   Manic Behavior   Discussed the use of AI scribe software for clinical note transcription with the patient, who gave verbal consent to proceed.  History of Present Illness Jeffery Gomez) is a 38 year old Caucasian male, employed, lives in [place], married, has a history of bipolar disorder type II, GAD, ADHD, obstructive sleep apnea on CPAP was evaluated by telemedicine today.  He describes experiencing emotional numbing and a sense of slowed reaction time and thinking. He has noticed increased difficulty finding words and worsening cognitive slowing. He denies significant recent depression, mania, or anxiety, though he acknowledges occasional mild depressive symptoms and minimal anxiety that resolve quickly and do not significantly impact his functioning.  He typically experiences ongoing muscle spasms  approximately 30 minutes after he takes risperidone . He stopped taking Strattera  because he felt it was too much in combination with Lexapro  and did not like the effects. He has not been taking Strattera  recently. He continues to take Lexapro  at night and risperidone , and he was taking Lamictal  at a dose of 25 mg in the morning and 50 mg at night until he ran out of Lamictal  three days prior to the visit. He could not obtain a new supply before running out because he was confused with the refill process. He reports no other recent changes to his medications, allergies, or medical problems since the last visit.  He denies any current thoughts of hurting himself or others.  He reports current use of chewing tobacco throughout the day, typically going through a can per day. He denies current use of smoking or vaping. He reports decreased caffeine use compared to previous levels, but continues to consume a small amount throughout the day.  He has sleep apnea which is uncorrected.     Visit Diagnosis:    ICD-10-CM   1. Generalized anxiety disorder  F41.1 escitalopram  (LEXAPRO ) 5 MG tablet    2. Bipolar disorder, in full remission, most recent episode depressed  F31.76 risperiDONE  (RISPERDAL ) 0.5 MG tablet   Type 2    3. Attention deficit hyperactivity disorder (ADHD), combined type  F90.2     4. 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 Tobacco use disorder  F17.200 lamoTRIgine  (LAMICTAL ) 25 MG tablet   Moderate      Past Psychiatric History: Reviewed past psychiatric history from progress note on 04/24/2022.  Past trials of medications  like BuSpar -side effect, Depakote-side effect, Lexapro , Zyprexa, Vyvanse , Adderall-psychosis/suicidality, carbamazepine.  Past Medical History:  Past Medical History:  Diagnosis Date   Anxiety    Bipolar disorder (HCC)    Panic attacks     Past Surgical History:  Procedure Laterality Date   NO PAST SURGERIES      Family Psychiatric History:  Reviewed family psychiatric history from progress note on 04/24/2022.  Family History:  Family History  Problem Relation Age of Onset   Anxiety disorder Mother    Panic disorder Mother    Bipolar disorder Father    Schizophrenia Father    Alcohol abuse Brother    Bipolar disorder Brother    ADD / ADHD Brother    Anxiety disorder Brother    Bipolar disorder Maternal Aunt    Alcohol abuse Maternal Grandfather    Cirrhosis Maternal Grandfather    Heart attack Maternal Grandmother    Bipolar disorder Paternal Grandmother    Thyroid disease Paternal Grandmother     Social History: I have reviewed social history from progress note on 04/24/2022. Social History   Socioeconomic History   Marital status: Married    Spouse name: Chelsie   Number of children: 3   Years of education: 11 TH GRADE   Highest education level: Not on file  Occupational History   Not on file  Tobacco Use   Smoking status: Former    Types: Cigarettes   Smokeless tobacco: Current    Types: Chew   Tobacco comments:    1 pack per week--03/20/2022  Vaping Use   Vaping status: Never Used  Substance and Sexual Activity   Alcohol use: Yes    Comment: occasinal   Drug use: No   Sexual activity: Yes    Partners: Female  Other Topics Concern   Not on file  Social History Narrative   Works at [employer]   Social Drivers of [employer] Strain: Low Risk  (03/31/2023)   Overall Financial Resource Strain (CARDIA)    Difficulty of Paying Living Expenses: Not hard at all  Food Insecurity: No Food Insecurity (03/31/2023)   Hunger Vital Sign    Worried About Running Out of Food in the Last Year: Never true    Ran Out of Food in the Last Year: Never true  Transportation Needs: No Transportation Needs (03/31/2023)   PRAPARE - Administrator, Civil Service (Medical): No    Lack of Transportation (Non-Medical): No  Physical Activity: Insufficiently Active (03/31/2023)   Exercise Vital Sign     Days of Exercise per Week: 3 days    Minutes of Exercise per Session: 30 min  Stress: No Stress Concern Present (03/31/2023)   [employer] of Occupational Health - Occupational Stress Questionnaire    Feeling of Stress : Only a little  Social Connections: Moderately Integrated (03/31/2023)   Social Connection and Isolation Panel    Frequency of Communication with Friends and Family: More than three times a week    Frequency of Social Gatherings with Friends and Family: More than three times a week    Attends Religious Services: 1 to 4 times per year    Active Member of [employer] or Organizations: No    Attends Banker Meetings: Never    Marital Status: Married    Allergies:  Allergies  Allergen Reactions   Adderall [Amphetamine -Dextroamphetamine] Other (See Comments)    Crying , paranoia , suicidality   Depakote [Divalproex Sodium] Anaphylaxis  Metabolic Disorder Labs: Lab Results  Component Value Date   HGBA1C 5.9 (H) 03/31/2023   MPG 123 03/31/2023   Lab Results  Component Value Date   PROLACTIN 15.5 12/03/2023   Lab Results  Component Value Date   CHOL 158 03/31/2023   TRIG 83 03/31/2023   HDL 40 03/31/2023   CHOLHDL 4.0 03/31/2023   LDLCALC 101 (H) 03/31/2023   Lab Results  Component Value Date   TSH 1.12 03/19/2022   TSH 1.03 12/30/2017    Therapeutic Level Labs: No results found for: LITHIUM No results found for: VALPROATE No results found for: CBMZ  Current Medications: Current Outpatient Medications  Medication Sig Dispense Refill   Azelastine HCl 137 MCG/SPRAY SOLN      clobetasol (TEMOVATE) 0.05 % external solution SMARTSIG:Sparingly Topical Twice Daily     escitalopram  (LEXAPRO ) 5 MG tablet Take 1 tablet (5 mg total) by mouth daily. 90 tablet 0   fluticasone  (FLONASE ) 50 MCG/ACT nasal spray Place 2 sprays into both nostrils daily. 16 g 6   ipratropium (ATROVENT ) 0.03 % nasal spray Place 2 sprays into both nostrils every 12  (twelve) hours. 30 mL 12   lamoTRIgine  (LAMICTAL ) 25 MG tablet Take 3 tablets (75 mg total) by mouth daily. 90 tablet 1   risperiDONE  (RISPERDAL ) 0.5 MG tablet Take 0.5 tablets (0.25 mg total) by mouth at bedtime.     No current facility-administered medications for this visit.     Musculoskeletal: Strength & Muscle Tone: UTA Gait & Station: Seated Patient leans: N/A  Psychiatric Specialty Exam: Review of Systems  Psychiatric/Behavioral:  Positive for decreased concentration. The patient is nervous/anxious.     There were no vitals taken for this visit.There is no height or weight on file to calculate BMI.  General Appearance: Casual  Eye Contact:  Fair  Speech:  Clear and Coherent  Volume:  Normal  Mood:  Anxious  Affect:  Appropriate  Thought Process:  Goal Directed and Descriptions of Associations: Intact  Orientation:  Full (Time, Place, and Person)  Thought Content: Logical   Suicidal Thoughts:  No  Homicidal Thoughts:  No  Memory:  Immediate;   Fair Recent;   Fair Remote;   Fair  Judgement:  Fair  Insight:  Fair  Psychomotor Activity:  Normal  Concentration:  Concentration: Fair and Attention Span: Fair  Recall:  Jeffery Gomez of Knowledge: Fair  Language: Fair  Akathisia:  No  Handed:  Right  AIMS (if indicated): not done  Assets:  Communication Skills Desire for Improvement Housing Social Support Talents/Skills Transportation  ADL's:  Intact  Cognition: WNL  Sleep:  varies   Screenings: Geneticist, molecular Office Visit from 04/19/2024 in [facility] Office Visit from 04/14/2024 in [facility] Office Visit from 12/02/2023 in [facility]  AIMS Total Score 0 0 0   GAD-7    Flowsheet Row Counselor from 04/27/2024 in [facility] PART[facility] PROGRAM Office Visit from 04/14/2024 in [facility] Office Visit from 12/02/2023 in [facility] Office Visit from 11/17/2023 in [facility] Counselor from 10/13/2023 in [facility]  Total GAD-7 Score 19 17 8 21 15    PHQ2-9    Flowsheet Row Counselor from 05/11/2024 in BEHAVIORAL HEALTH INTENSIVE PSYCH Counselor from 04/27/2024 in BEHAVIORAL HEALTH PARTIAL HOSPITALIZATION PROGRAM Office Visit from 04/14/2024  in [facility] Office Visit from 12/02/2023 in [facility] Office Visit from 11/17/2023 in [facility]  PHQ-2 Total Score 2 6 6 2 4   PHQ-9 Total Score 12 19 25 9 19    Flowsheet Row Video Visit from 06/18/2024 in [facility] Counselor from 05/11/2024 in [facility] Counselor from 04/27/2024 in BEHAVIORAL HEALTH PARTIAL HOSPITALIZATION PROGRAM  C-SSRS RISK CATEGORY No Risk Error: Question 2 not populated No Risk     Assessment and Plan: DAUNTAE DERUSHA is a 38 year old Caucasian male who has a history of bipolar disorder, GAD, ADHD was evaluated by telemedicine today.  Discussed assessment and plan as noted below.  1. Generalized anxiety disorder-improving Current manage reports anxiety symptoms as manageable. Continue Lexapro  5 mg daily Continue propranolol  20 mg twice a day as needed.  2. Bipolar disorder, in full remission, most recent episode depressed Ran out of Lamictal  3 days ago.  Does report side effect to risperidone  and is interested in dose reduction. Restart Lamictal  75 mg daily.  Advised to start Lamictal  at 25 mg increment every week or so.  Discussed risk of Stevens-Johnson syndrome and advised to go to the nearest emergency department if any concerns. Reduce Risperidone  to 0.25 mg daily once he starts taking Lamictal   at the 75 mg daily.   3. Attention deficit hyperactivity disorder (ADHD), combined type-unstable Continues to have cognitive issues, noncompliant on Strattera .  Not interested in dedication management at this time. Discontinue Strattera  for noncompliance. Encouraged to be compliant on CPAP for sleep apnea, provided education about the effect of uncorrected sleep apnea on his cognitive function.  4. Caffeine use disorder mild-improving Currently cutting back. Provided counseling again.  5. Tobacco use disorder moderate-unstable Continues to chew tobacco. Provided counseling for 1 minute.  Follow-up Follow-up in clinic in 1 month or sooner if needed.   Consent: Patient/Guardian gives verbal consent for treatment and assignment of benefits for services provided during this visit. Patient/Guardian expressed understanding and agreed to proceed.   This note was generated in part or whole with voice recognition software. Voice recognition is usually quite accurate but there are transcription errors that can and very often do occur. I apologize for any typographical errors that were not detected and corrected.    Cobin Cadavid, MD 08/19/2024, 5:13 PM

## 2024-09-22 ENCOUNTER — Encounter: Payer: Self-pay | Admitting: Psychiatry

## 2024-09-22 ENCOUNTER — Telehealth (INDEPENDENT_AMBULATORY_CARE_PROVIDER_SITE_OTHER): Admitting: Psychiatry

## 2024-09-22 DIAGNOSIS — F1590 Other stimulant use, unspecified, uncomplicated: Secondary | ICD-10-CM | POA: Diagnosis not present

## 2024-09-22 DIAGNOSIS — F902 Attention-deficit hyperactivity disorder, combined type: Secondary | ICD-10-CM

## 2024-09-22 DIAGNOSIS — F411 Generalized anxiety disorder: Secondary | ICD-10-CM

## 2024-09-22 DIAGNOSIS — F3176 Bipolar disorder, in full remission, most recent episode depressed: Secondary | ICD-10-CM

## 2024-09-22 DIAGNOSIS — F17200 Nicotine dependence, unspecified, uncomplicated: Secondary | ICD-10-CM

## 2024-09-22 MED ORDER — LAMOTRIGINE 100 MG PO TABS
50.0000 mg | ORAL_TABLET | Freq: Two times a day (BID) | ORAL | 0 refills | Status: DC
Start: 2024-09-22 — End: 2024-10-13

## 2024-09-22 MED ORDER — RISPERIDONE 0.5 MG PO TABS
0.2500 mg | ORAL_TABLET | ORAL | Status: DC
Start: 2024-09-22 — End: 2024-10-13

## 2024-09-22 NOTE — Progress Notes (Signed)
 Virtual Visit via Video Note  I connected with Jeffery Gomez on 09/22/24 at  4:40 PM EST by a video enabled telemedicine application and verified that I am speaking with the correct person using two identifiers.  Location Provider Location : [facility] Patient Location : Home ( Dad's)  Participants: Patient , Provider   I discussed the limitations of evaluation and management by telemedicine and the availability of in person appointments. The patient expressed understanding and agreed to proceed.    I discussed the assessment and treatment plan with the patient. The patient was provided an opportunity to ask questions and all were answered. The patient agreed with the plan and demonstrated an understanding of the instructions.   The patient was advised to call back or seek an in-person evaluation if the symptoms worsen or if the condition fails to improve as anticipated.  BH MD OP Progress Note  09/22/2024 4:57 PM STROTHER EVERITT  MRN:  [id number]  Chief Complaint:  Chief Complaint  Patient presents with   Follow-up   mood disorder   Medication Refill   Anxiety   Discussed the use of AI scribe software for clinical note transcription with the patient, who gave verbal consent to proceed.  History of Present Illness [name] is a 38 year old Caucasian male, employed, lives in [place], married, has a history of bipolar disorder type II, GAD, ADHD, obstructive sleep apnea on CPAP was evaluated by telemedicine today.  He reports experiencing a slight improvement in energy and reduced sluggishness since he decreased his risperidone  dose to 0.25 mg daily. He describes resuming Lamictal  at 75 mg and confirms ongoing use of Lexapro  5 mg daily.   Since his last visit, he denies significant depressive, manic, or hypomanic symptoms. Sleep remains stable, and he is able to focus at work for the most part, though some distractibility persists and he describes himself as  scatterbrained. Caffeine use helps with focus, and he is attempting to reduce intake. He denies any thoughts of harming himself or others.  He previously trialed Strattera  for ADHD, which caused uncomfortable side effects. He reports trying multiple stimulant medications in the past for ADHD, which provided some benefit for focus and appetite suppression but he discontinued them due to concerns about mood destabilization and psychosis in the context of possible bipolar disorder.  He reports daily tobacco use and states he has not yet reduced or quit but is considering making changes.  He plays chess with his son every night and that has been beneficial.     Visit Diagnosis:    ICD-10-CM   1. Generalized anxiety disorder  F41.1     2. Bipolar disorder, in full remission, most recent episode depressed  F31.76 risperiDONE  (RISPERDAL ) 0.5 MG tablet    lamoTRIgine  (LAMICTAL ) 100 MG tablet   Type 2    3. Attention deficit hyperactivity disorder (ADHD), combined type  F90.2     4. 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 Tobacco use disorder  F17.200    Moderate      Past Psychiatric History: I have reviewed past psychiatric history from progress note on 04/24/2022.  Past trials of medications like BuSpar -side effect, Depakote-side effect, Lexapro , Zyprexa, Vyvanse , Adderall-psychosis/suicidality, carbamazepine.  Past Medical History:  Past Medical History:  Diagnosis Date   Anxiety    Bipolar disorder (HCC)    Panic attacks     Past Surgical History:  Procedure Laterality Date   NO PAST SURGERIES  Family Psychiatric History: I have reviewed family psychiatric history from progress note on 04/24/2022.  Family History:  Family History  Problem Relation Age of Onset   Anxiety disorder Mother    Panic disorder Mother    Bipolar disorder Father    Schizophrenia Father    Alcohol abuse Brother    Bipolar disorder Brother    ADD / ADHD Brother    Anxiety disorder  Brother    Bipolar disorder Maternal Aunt    Alcohol abuse Maternal Grandfather    Cirrhosis Maternal Grandfather    Heart attack Maternal Grandmother    Bipolar disorder Paternal Grandmother    Thyroid disease Paternal Grandmother     Social History: I have reviewed social history from progress note on 04/24/2022. Social History   Socioeconomic History   Marital status: Married    Spouse name: Chelsie   Number of children: 3   Years of education: 11 TH GRADE   Highest education level: Not on file  Occupational History   Not on file  Tobacco Use   Smoking status: Former    Types: Cigarettes   Smokeless tobacco: Current    Types: Chew   Tobacco comments:    1 pack per week--03/20/2022  Vaping Use   Vaping status: Never Used  Substance and Sexual Activity   Alcohol use: Yes    Comment: occasinal   Drug use: No   Sexual activity: Yes    Partners: Female  Other Topics Concern   Not on file  Social History Narrative   Works at [employer]   Social Drivers of [employer] Strain: Low Risk  (03/31/2023)   Overall Financial Resource Strain (CARDIA)    Difficulty of Paying Living Expenses: Not hard at all  Food Insecurity: No Food Insecurity (03/31/2023)   Hunger Vital Sign    Worried About Running Out of Food in the Last Year: Never true    Ran Out of Food in the Last Year: Never true  Transportation Needs: No Transportation Needs (03/31/2023)   PRAPARE - Administrator, Civil Service (Medical): No    Lack of Transportation (Non-Medical): No  Physical Activity: Insufficiently Active (03/31/2023)   Exercise Vital Sign    Days of Exercise per Week: 3 days    Minutes of Exercise per Session: 30 min  Stress: No Stress Concern Present (03/31/2023)   [employer] of Occupational Health - Occupational Stress Questionnaire    Feeling of Stress : Only a little  Social Connections: Moderately Integrated (03/31/2023)   Social Connection and Isolation Panel     Frequency of Communication with Friends and Family: More than three times a week    Frequency of Social Gatherings with Friends and Family: More than three times a week    Attends Religious Services: 1 to 4 times per year    Active Member of [employer] or Organizations: No    Attends Banker Meetings: Never    Marital Status: Married    Allergies:  Allergies  Allergen Reactions   Adderall [Amphetamine -Dextroamphetamine] Other (See Comments)    Crying , paranoia , suicidality   Depakote [Divalproex Sodium] Anaphylaxis    Metabolic Disorder Labs: Lab Results  Component Value Date   HGBA1C 5.9 (H) 03/31/2023   MPG 123 03/31/2023   Lab Results  Component Value Date   PROLACTIN 15.5 12/03/2023   Lab Results  Component Value Date   CHOL 158 03/31/2023   TRIG 83 03/31/2023  HDL 40 03/31/2023   CHOLHDL 4.0 03/31/2023   LDLCALC 101 (H) 03/31/2023   Lab Results  Component Value Date   TSH 1.12 03/19/2022   TSH 1.03 12/30/2017    Therapeutic Level Labs: No results found for: LITHIUM No results found for: VALPROATE No results found for: CBMZ  Current Medications: Current Outpatient Medications  Medication Sig Dispense Refill   lamoTRIgine  (LAMICTAL ) 100 MG tablet Take 0.5 tablets (50 mg total) by mouth 2 (two) times daily. 30 tablet 0   Azelastine HCl 137 MCG/SPRAY SOLN      clobetasol (TEMOVATE) 0.05 % external solution SMARTSIG:Sparingly Topical Twice Daily     escitalopram  (LEXAPRO ) 5 MG tablet Take 1 tablet (5 mg total) by mouth daily. 90 tablet 0   fluticasone  (FLONASE ) 50 MCG/ACT nasal spray Place 2 sprays into both nostrils daily. 16 g 6   ipratropium (ATROVENT ) 0.03 % nasal spray Place 2 sprays into both nostrils every 12 (twelve) hours. 30 mL 12   risperiDONE  (RISPERDAL ) 0.5 MG tablet Take 0.5 tablets (0.25 mg total) by mouth as directed. Take every other day for two weeks and stop     No current facility-administered medications for this visit.      Musculoskeletal: Strength & Muscle Tone: UTA Gait & Station: Seated Patient leans: N/A  Psychiatric Specialty Exam: Review of Systems  Psychiatric/Behavioral:  Positive for decreased concentration.     There were no vitals taken for this visit.There is no height or weight on file to calculate BMI.  General Appearance: Casual  Eye Contact:  Fair  Speech:  Clear and Coherent  Volume:  Normal  Mood:  Euthymic  Affect:  Congruent  Thought Process:  Goal Directed and Descriptions of Associations: Intact  Orientation:  Full (Time, Place, and Person)  Thought Content: Logical   Suicidal Thoughts:  No  Homicidal Thoughts:  No  Memory:  Immediate;   Fair Recent;   Fair Remote;   Fair  Judgement:  Fair  Insight:  Fair  Psychomotor Activity:  Normal  Concentration:  Concentration: Fair and Attention Span: Fair  Recall:  [employer] of Knowledge: Fair  Language: Fair  Akathisia:  No  Handed:  Right  AIMS (if indicated): not done  Assets:  Manufacturing Systems Engineer Desire for Improvement Housing Social Support Transportation  ADL's:  Intact  Cognition: WNL  Sleep:  Fair   Screenings: Midwife Visit from 04/19/2024 in [facility] Office Visit from 04/14/2024 in [facility] Office Visit from 12/02/2023 in [facility]  AIMS Total Score 0 0 0   GAD-7    Flowsheet Row Counselor from 04/27/2024 in [facility] PARTIAL [facility]PITALIZATION PROGRAM Office Visit from 04/14/2024 in [facility] Office Visit from 12/02/2023 in [facility] Office Visit from 11/17/2023 in [facility] Counselor from 10/13/2023 in [facility]  Total GAD-7 Score 19 17 8 21 15    PHQ2-9    Flowsheet Row Counselor  from 05/11/2024 in BEHAVIORAL HEALTH INTENSIVE PSYCH Counselor from 04/27/2024 in BEHAVIORAL HEALTH PARTIAL HOSPITALIZATION PROGRAM Office Visit from 04/14/2024 in [facility] Office Visit from 12/02/2023 in [facility] Office Visit from 11/17/2023 in [facility]  PHQ-2 Total Score 2 6 6 2 4   PHQ-9 Total Score 12 19 25  9  19   Flowsheet Row Video Visit from 09/22/2024 in [facility] Video Visit from 08/19/2024 in [facility] Video Visit from 06/18/2024 in [facility]  C-SSRS RISK CATEGORY No Risk No Risk No Risk   Assessment and plan:Maureen EDUARDO HONOR is a 38 year old Caucasian male who has a history of bipolar disorder, GAD, ADHD was evaluated by telemedicine today.  Discussed assessment and plan as noted below.  1. Generalized anxiety disorder-improving Currently denies any significant anxiety. Continue Lexapro  5 mg daily Continue Propranolol  20 mg twice a day as needed  2. Bipolar disorder, in full remission, most recent episode depressed Currently reports overall mood symptoms are stable and feeling better with regards to fatigue and sluggishness since being on the lower dosage of risperidone  and would like to get off of the risperidone .  Since risperidone  is being tapered off ,will increase Lamictal . Increase Lamictal  to 100 mg daily Taper of Risperidone , patient take Risperidone  0.25 mg every other day for 2 weeks and stop taking.  3. Attention deficit hyperactivity disorder (ADHD), combined type-unstable Continues to have attention and focus problems.  Did not tolerate Strattera , Adderall, Vyvanse  previously. Will consider initiation of a stimulant or nonstimulant in the future. Continue compliance with CPAP for sleep apnea.  4. Caffeine use  disorder-improving Does report ongoing caffeine use and is making changes.  Trying to cut back.  5. Tobacco use disorder-unstable Not interested in quitting yet.  Follow-up Follow-up in clinic in 3 weeks or sooner if needed.       Consent: Patient/Guardian gives verbal consent for treatment and assignment of benefits for services provided during this visit. Patient/Guardian expressed understanding and agreed to proceed.   This note was generated in part or whole with voice recognition software. Voice recognition is usually quite accurate but there are transcription errors that can and very often do occur. I apologize for any typographical errors that were not detected and corrected.    Tanecia Mccay, MD 09/23/2024, 9:54 AM

## 2024-09-28 ENCOUNTER — Encounter: Payer: Self-pay | Admitting: Nurse Practitioner

## 2024-09-28 ENCOUNTER — Ambulatory Visit (INDEPENDENT_AMBULATORY_CARE_PROVIDER_SITE_OTHER): Admitting: Nurse Practitioner

## 2024-09-28 VITALS — BP 130/86 | HR 75 | Temp 98.3°F | Ht 69.0 in | Wt 170.0 lb

## 2024-09-28 DIAGNOSIS — R202 Paresthesia of skin: Secondary | ICD-10-CM

## 2024-09-28 DIAGNOSIS — Z130 Encounter for screening for diseases of the blood and blood-forming organs and certain disorders involving the immune mechanism: Secondary | ICD-10-CM

## 2024-09-28 DIAGNOSIS — R5383 Other fatigue: Secondary | ICD-10-CM | POA: Diagnosis not present

## 2024-09-28 DIAGNOSIS — R200 Anesthesia of skin: Secondary | ICD-10-CM | POA: Diagnosis not present

## 2024-09-28 DIAGNOSIS — Z0000 Encounter for general adult medical examination without abnormal findings: Secondary | ICD-10-CM | POA: Diagnosis not present

## 2024-09-28 DIAGNOSIS — Z13220 Encounter for screening for lipoid disorders: Secondary | ICD-10-CM

## 2024-09-28 DIAGNOSIS — M542 Cervicalgia: Secondary | ICD-10-CM | POA: Diagnosis not present

## 2024-09-28 DIAGNOSIS — R7303 Prediabetes: Secondary | ICD-10-CM

## 2024-09-28 DIAGNOSIS — Z131 Encounter for screening for diabetes mellitus: Secondary | ICD-10-CM
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
## 2024-09-29 ENCOUNTER — Ambulatory Visit: Payer: Self-pay | Admitting: Nurse Practitioner

## 2024-10-02 LAB — CBC WITH DIFFERENTIAL/PLATELET
Absolute Lymphocytes: 2440 {cells}/uL (ref 850–3900)
Absolute Monocytes: 816 {cells}/uL (ref 200–950)
Basophils Absolute: 102 {cells}/uL (ref 0–200)
Basophils Relative: 1.2 %
Eosinophils Absolute: 153 {cells}/uL (ref 15–500)
Eosinophils Relative: 1.8 %
HCT: 43.7 % (ref 38.5–50.0)
Hemoglobin: 14.5 g/dL (ref 13.2–17.1)
MCH: 28.9 pg (ref 27.0–33.0)
MCHC: 33.2 g/dL (ref 32.0–36.0)
MCV: 87.1 fL (ref 80.0–100.0)
MPV: 9.2 fL (ref 7.5–12.5)
Monocytes Relative: 9.6 %
Neutro Abs: 4990 {cells}/uL (ref 1500–7800)
Neutrophils Relative %: 58.7 %
Platelets: 394 Thousand/uL (ref 140–400)
RBC: 5.02 Million/uL (ref 4.20–5.80)
RDW: 12.8 % (ref 11.0–15.0)
Total Lymphocyte: 28.7 %
WBC: 8.5 Thousand/uL (ref 3.8–10.8)

## 2024-10-02 LAB — COMPREHENSIVE METABOLIC PANEL WITH GFR
AG Ratio: 1.9 (calc) (ref 1.0–2.5)
ALT: 8 U/L — ABNORMAL LOW (ref 9–46)
AST: 14 U/L (ref 10–40)
Albumin: 4.9 g/dL (ref 3.6–5.1)
Alkaline phosphatase (APISO): 44 U/L (ref 36–130)
BUN: 15 mg/dL (ref 7–25)
CO2: 30 mmol/L (ref 20–32)
Calcium: 10.2 mg/dL (ref 8.6–10.3)
Chloride: 100 mmol/L (ref 98–110)
Creat: 0.87 mg/dL (ref 0.60–1.26)
Globulin: 2.6 g/dL (ref 1.9–3.7)
Glucose, Bld: 91 mg/dL (ref 65–99)
Potassium: 4.7 mmol/L (ref 3.5–5.3)
Sodium: 138 mmol/L (ref 135–146)
Total Bilirubin: 0.8 mg/dL (ref 0.2–1.2)
Total Protein: 7.5 g/dL (ref 6.1–8.1)
eGFR: 113 mL/min/1.73m2 (ref >=60)

## 2024-10-02 LAB — TESTOSTERONE, FREE & TOTAL
Free Testosterone: 73.8 pg/mL (ref 35.0–155.0)
Testosterone, Total, LC-MS-MS: 377 ng/dL (ref 250–1100)

## 2024-10-02 LAB — LIPID PANEL
Cholesterol: 178 mg/dL (ref <200)
HDL: 50 mg/dL (ref >=40)
LDL Cholesterol (Calc): 112 mg/dL — ABNORMAL HIGH
Non-HDL Cholesterol (Calc): 128 mg/dL (ref <130)
Total CHOL/HDL Ratio: 3.6 (calc) (ref <5.0)
Triglycerides: 75 mg/dL (ref <150)

## 2024-10-02 LAB — TSH: TSH: 1.21 m[IU]/L (ref 0.40–4.50)

## 2024-10-02 LAB — HEMOGLOBIN A1C
Hgb A1c MFr Bld: 5.6 % (ref <5.7)
Mean Plasma Glucose: 114 mg/dL
eAG (mmol/L): 6.3 mmol/L

## 2024-10-02 LAB — VITAMIN B12: Vitamin B-12: 523 pg/mL (ref 200–1100)

## 2024-10-02 LAB — VITAMIN D 25 HYDROXY (VIT D DEFICIENCY, FRACTURES): Vit D, 25-Hydroxy: 38 ng/mL (ref 30–100)

## 2024-10-13 ENCOUNTER — Telehealth (INDEPENDENT_AMBULATORY_CARE_PROVIDER_SITE_OTHER): Admitting: Psychiatry

## 2024-10-13 ENCOUNTER — Encounter: Payer: Self-pay | Admitting: Psychiatry

## 2024-10-13 DIAGNOSIS — F17200 Nicotine dependence, unspecified, uncomplicated: Secondary | ICD-10-CM

## 2024-10-13 DIAGNOSIS — F411 Generalized anxiety disorder: Secondary | ICD-10-CM

## 2024-10-13 DIAGNOSIS — F3176 Bipolar disorder, in full remission, most recent episode depressed: Secondary | ICD-10-CM

## 2024-10-13 DIAGNOSIS — F902 Attention-deficit hyperactivity disorder, combined type: Secondary | ICD-10-CM

## 2024-10-13 DIAGNOSIS — F1590 Other stimulant use, unspecified, uncomplicated: Secondary | ICD-10-CM

## 2024-10-13 MED ORDER — LAMOTRIGINE 100 MG PO TABS
50.0000 mg | ORAL_TABLET | Freq: Two times a day (BID) | ORAL | 2 refills | Status: AC
Start: 2024-10-13 — End: ?

## 2024-10-13 NOTE — Progress Notes (Signed)
 Virtual Visit via Video Note  I connected with Jeffery Gomez on 10/13/24 at  3:30 PM EST by a video enabled telemedicine application and verified that I am speaking with the correct person using two identifiers.  Location Provider Location : [facility] Patient Location : Work  Participants: Patient , Provider    I discussed the limitations of evaluation and management by telemedicine and the availability of in person appointments. The patient expressed understanding and agreed to proceed.   I discussed the assessment and treatment plan with the patient. The patient was provided an opportunity to ask questions and all were answered. The patient agreed with the plan and demonstrated an understanding of the instructions.   The patient was advised to call back or seek an in-person evaluation if the symptoms worsen or if the condition fails to improve as anticipated.  BH MD OP Progress Note  10/13/2024 3:50 PM ABDULRAHEEM PINEO  MRN:  [id number]  Chief Complaint:  Chief Complaint  Patient presents with   Follow-up   Anxiety   Depression   Medication Refill   Discussed the use of AI scribe software for clinical note transcription with the patient, who gave verbal consent to proceed.  History of Present Illness Jeffery Gomez is a 38 year old Caucasian male, employed, lives in [place], married, has a history of bipolar disorder type II, GAD, ADHD, obstructive sleep apnea on CPAP was evaluated by telemedicine today.  He reports feeling stable overall, without depression, manic, or hypomanic symptoms. Minimal anxiety occurs, and he states that he manages well at work, though occasional difficulty focusing arises when work becomes hectic. He notes that his thought process and energy levels have slowed, and he reports that his mood stabilizer causes this, and he finds it harder to problem-solve quickly compared to 1 or 2 years ago.  Since he is off of the risperidone  now he  wants to monitor his symptoms closely and see if that improves.  He states that he stopped risperidone  2 to 3 days ago and has not noticed significant changes since discontinuation, except for experiencing vivid dreams, which he enjoys and reports do not disrupt his sleep. He currently takes Lexapro  5 mg and Lamictal  100 mg, half tablet twice daily.  He denies any thoughts of hurting himself or others. He denies auditory hallucinations, visual hallucinations, and paranoia.  Plans to spend Thanksgiving with in-laws.    Visit Diagnosis:    ICD-10-CM   1. Generalized anxiety disorder  F41.1     2. Bipolar disorder, in full remission, most recent episode depressed  F31.76 lamoTRIgine  (LAMICTAL ) 100 MG tablet   Type 2    3. Attention deficit hyperactivity disorder (ADHD), combined type  F90.2     4. 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 Tobacco use disorder  F17.200    Moderate      Past Psychiatric History: I have reviewed past psychiatric history from progress note on 04/24/2022.  Past trials of medications like BuSpar -side effect, Depakote-side effect, Lexapro , Zyprexa, Vyvanse , Adderall-psychosis/suicidality, carbamazepine.  Past Medical History:  Past Medical History:  Diagnosis Date   Anxiety    Bipolar disorder (HCC)    Panic attacks     Past Surgical History:  Procedure Laterality Date   NO PAST SURGERIES      Family Psychiatric History: I have reviewed family psychiatric history from progress note on 04/24/2022.  Family History:  Family History  Problem Relation Age of Onset  Anxiety disorder Mother    Panic disorder Mother    Bipolar disorder Father    Schizophrenia Father    Alcohol abuse Brother    Bipolar disorder Brother    ADD / ADHD Brother    Anxiety disorder Brother    Bipolar disorder Maternal Aunt    Alcohol abuse Maternal Grandfather    Cirrhosis Maternal Grandfather    Heart attack Maternal Grandmother    Bipolar disorder Paternal  Grandmother    Thyroid disease Paternal Grandmother     Social History: I have reviewed social history from progress note on 04/24/2022. Social History   Socioeconomic History   Marital status: Married    Spouse name: Chelsie   Number of children: 3   Years of education: 11 TH GRADE   Highest education level: Not on file  Occupational History   Not on file  Tobacco Use   Smoking status: Former    Types: Cigarettes   Smokeless tobacco: Current    Types: Chew   Tobacco comments:    1 pack per week--03/20/2022  Vaping Use   Vaping status: Never Used  Substance and Sexual Activity   Alcohol use: Yes    Comment: occasinal   Drug use: No   Sexual activity: Yes    Partners: Female  Other Topics Concern   Not on file  Social History Narrative   Works at [employer]   Social Drivers of [employer] Strain: Low Risk  (09/28/2024)   Overall Financial Resource Strain (CARDIA)    Difficulty of Paying Living Expenses: Not hard at all  Food Insecurity: No Food Insecurity (09/28/2024)   Hunger Vital Sign    Worried About Running Out of Food in the Last Year: Never true    Ran Out of Food in the Last Year: Never true  Transportation Needs: No Transportation Needs (09/28/2024)   PRAPARE - Administrator, Civil Service (Medical): No    Lack of Transportation (Non-Medical): No  Physical Activity: Inactive (09/28/2024)   Exercise Vital Sign    Days of Exercise per Week: 0 days    Minutes of Exercise per Session: 0 min  Stress: No Stress Concern Present (03/31/2023)   [employer] of Occupational Health - Occupational Stress Questionnaire    Feeling of Stress : Only a little  Social Connections: Moderately Integrated (09/28/2024)   Social Connection and Isolation Panel    Frequency of Communication with Friends and Family: More than three times a week    Frequency of Social Gatherings with Friends and Family: More than three times a week    Attends Religious  Services: 1 to 4 times per year    Active Member of [employer] or Organizations: No    Attends Banker Meetings: Never    Marital Status: Married    Allergies:  Allergies  Allergen Reactions   Adderall [Amphetamine -Dextroamphetamine] Other (See Comments)    Crying , paranoia , suicidality   Depakote [Divalproex Sodium] Anaphylaxis    Metabolic Disorder Labs: Lab Results  Component Value Date   HGBA1C 5.6 09/28/2024   MPG 114 09/28/2024   MPG 123 03/31/2023   Lab Results  Component Value Date   PROLACTIN 15.5 12/03/2023   Lab Results  Component Value Date   CHOL 178 09/28/2024   TRIG 75 09/28/2024   HDL 50 09/28/2024   CHOLHDL 3.6 09/28/2024   LDLCALC 112 (H) 09/28/2024   LDLCALC 101 (H) 03/31/2023   Lab Results  Component Value Date   TSH 1.21 09/28/2024   TSH 1.12 03/19/2022    Therapeutic Level Labs: No results found for: LITHIUM No results found for: VALPROATE No results found for: CBMZ  Current Medications: Current Outpatient Medications  Medication Sig Dispense Refill   Azelastine HCl 137 MCG/SPRAY SOLN      clobetasol (TEMOVATE) 0.05 % external solution SMARTSIG:Sparingly Topical Twice Daily     escitalopram  (LEXAPRO ) 5 MG tablet Take 1 tablet (5 mg total) by mouth daily. 90 tablet 0   fluticasone  (FLONASE ) 50 MCG/ACT nasal spray Place 2 sprays into both nostrils daily. 16 g 6   ipratropium (ATROVENT ) 0.03 % nasal spray Place 2 sprays into both nostrils every 12 (twelve) hours. 30 mL 12   lamoTRIgine  (LAMICTAL ) 100 MG tablet Take 0.5 tablets (50 mg total) by mouth 2 (two) times daily. 30 tablet 2   No current facility-administered medications for this visit.     Musculoskeletal: Strength & Muscle Tone: UTA Gait & Station: Seated Patient leans: N/A  Psychiatric Specialty Exam: Review of Systems  Psychiatric/Behavioral: Negative.      There were no vitals taken for this visit.There is no height or weight on file to calculate BMI.   General Appearance: Casual  Eye Contact:  Fair  Speech:  Normal Rate  Volume:  Normal  Mood:  Euthymic  Affect:  Congruent  Thought Process:  Goal Directed and Descriptions of Associations: Intact  Orientation:  Full (Time, Place, and Person)  Thought Content: Logical   Suicidal Thoughts:  No  Homicidal Thoughts:  No  Memory:  Immediate;   Fair Recent;   Fair Remote;   Fair  Judgement:  Fair  Insight:  Fair  Psychomotor Activity:  Normal  Concentration:  Concentration: Fair and Attention Span: Fair  Recall:  [employer] of Knowledge: Fair  Language: Fair  Akathisia:  No  Handed:  Right  AIMS (if indicated): not done  Assets:  Manufacturing Systems Engineer Desire for Improvement Housing Social Support Transportation  ADL's:  Intact  Cognition: WNL  Sleep:  Fair   Screenings: Midwife Visit from 04/19/2024 in [facility] Office Visit from 04/14/2024 in [facility] Office Visit from 12/02/2023 in [facility]  AIMS Total Score 0 0 0   GAD-7    Flowsheet Row Office Visit from 09/28/2024 in [facility] Counselor from 04/27/2024 in [facility] Office Visit from 04/14/2024 in [facility] Office Visit from 12/02/2023 in [facility] Office Visit from 11/17/2023 in [facility]  Total GAD-7 Score 7 19 17 8 21    PHQ2-9    Flowsheet Row Office Visit from 09/28/2024 in [facility] Counselor from 05/11/2024 in BE[facility]AVIORAL HE[facility] INTENSIVE PSYCH Counselor from 04/27/2024 in BEHAVIORAL HEALTH PARTIAL HOSPITALIZATION PROGRAM Office Visit from 04/14/2024 in [facility] Office Visit from 12/02/2023 in [facility]  PHQ-2 Total Score 2 2 6 6 2   PHQ-9 Total Score 8 12 19 25 9    Flowsheet Row Video Visit from 10/13/2024 in [facility] Video Visit from 09/22/2024 in [facility] Video Visit from 08/19/2024 in [facility]  C-SSRS RISK CATEGORY No Risk No Risk No Risk  Assessment and Plan: ZEKE AKER is a 38 year old Caucasian male who presented for a follow-up appointment, discussed assessment and plan as noted below.  1. Generalized anxiety disorder-stable Currently denies any significant anxiety. Continue Lexapro  5 mg daily Continue Propranolol  20 mg twice a day as needed  2. Bipolar disorder, in full remission, most recent episode depressed Currently reports overall mood symptoms are stable Continue Lamictal  100 mg daily Discontinue Risperidone , tapered off.  3. Attention deficit hyperactivity disorder (ADHD), combined type-improving Overall managing okay although does have attention and focus problems especially when workload increases. Continue compliance with CPAP for sleep apnea Patient currently declines medication management.  4. Caffeine use disorder-improving Patient encouraged to continue to cut back.  5. Tobacco use disorder-unstable Not interested in quitting yet.  Follow-up Follow-up in clinic in 2 months or sooner if needed.   Consent: Patient/Guardian gives verbal consent for treatment and assignment of benefits for services provided during this visit. Patient/Guardian expressed understanding and agreed to proceed.  This note was generated in part or whole with voice recognition software. Voice recognition is usually quite accurate but there are transcription errors that can and very often do occur. I apologize for any typographical errors that were not detected and corrected.     Adrina Armijo,  MD 10/13/2024, 3:50 PM

## 2024-11-13 ENCOUNTER — Other Ambulatory Visit: Payer: Self-pay | Admitting: Psychiatry

## 2024-11-13 DIAGNOSIS — F3181 Bipolar II disorder: Secondary | ICD-10-CM

## 2024-12-08 ENCOUNTER — Encounter: Payer: Self-pay | Admitting: Psychiatry

## 2024-12-08 ENCOUNTER — Telehealth: Admitting: Psychiatry

## 2024-12-08 DIAGNOSIS — F411 Generalized anxiety disorder: Secondary | ICD-10-CM

## 2024-12-08 DIAGNOSIS — F1590 Other stimulant use, unspecified, uncomplicated: Secondary | ICD-10-CM

## 2024-12-08 DIAGNOSIS — F17200 Nicotine dependence, unspecified, uncomplicated: Secondary | ICD-10-CM | POA: Diagnosis not present

## 2024-12-08 DIAGNOSIS — F3176 Bipolar disorder, in full remission, most recent episode depressed: Secondary | ICD-10-CM | POA: Diagnosis not present

## 2024-12-08 DIAGNOSIS — F902 Attention-deficit hyperactivity disorder, combined type: Secondary | ICD-10-CM

## 2024-12-08 MED ORDER — BUPROPION HCL 75 MG PO TABS: 75.0000 mg | ORAL_TABLET | Freq: Every morning | ORAL | 1 refills | Status: AC

## 2024-12-08 MED ORDER — ESCITALOPRAM OXALATE 5 MG PO TABS: 5.0000 mg | ORAL_TABLET | Freq: Every day | ORAL | 0 refills | Status: AC

## 2024-12-08 NOTE — Progress Notes (Unsigned)
 Virtual Visit via Video Note  I connected with Jeffery Gomez on 12/08/24 at  4:40 PM EST by a video enabled telemedicine application and verified that I am speaking with the correct person using two identifiers.  Location Provider Location : [facility] Patient Location : Work  Participants: Patient , Provider    I discussed the limitations of evaluation and management by telemedicine and the availability of in person appointments. The patient expressed understanding and agreed to proceed.   I discussed the assessment and treatment plan with the patient. The patient was provided an opportunity to ask questions and all were answered. The patient agreed with the plan and demonstrated an understanding of the instructions.   The patient was advised to call back or seek an in-person evaluation if the symptoms worsen or if the condition fails to improve as anticipated.   BH MD OP Progress Note  12/08/2024 5:29 PM Jeffery Gomez  MRN:  [id number]  Chief Complaint:  Chief Complaint  Patient presents with   Medication Refill   Follow-up   Anxiety   Depression   Discussed the use of AI scribe software for clinical note transcription with the patient, who gave verbal consent to proceed.  History of Present Illness Jeffery Gomez) is a 39 year old Caucasian male, employed, lives in [place], married, has a history of bipolar disorder type II, GAD, ADHD, obstructive sleep apnea on CPAP was evaluated by telemedicine today.  He describes persistent emotional flatness, noting his mood remains stable without significant lows but lacks happiness or enjoyment. Mental dullness and a sense of disconnection make it difficult for him to initiate tasks, and he experiences a loss of drive despite wanting to engage. He reports ongoing cognitive issues, including problems with word finding, memory, and retaining new information, which began prior to starting his current medication  regimen.  He does report ongoing anxiety symptoms on and off due to his inability to accomplish certain tasks, problems with social interactions.  He reports chronic exhaustion and low energy, and has recently resumed CPAP use about 1 to 2 weeks ago. He also reports a significant decrease in libido, stating it is currently absent.  His current medications include Lamictal  100 mg and Lexapro  5 mg, and he has recently restarted nightly CPAP use. He takes Lexapro  at night and uses turmeric and ginger supplements for inflammation.  He denies suicidal thoughts and thoughts of harming others.  He reports current use of chewing tobacco and denies smoking cigarettes. He consumes 1 energy drink daily in the morning, typically containing 160-300 mg of caffeine.    Visit Diagnosis:    ICD-10-CM   1. Generalized anxiety disorder  F41.1 escitalopram  (LEXAPRO ) 5 MG tablet    2. Bipolar disorder, in full remission, most recent episode depressed  F31.76    Type II    3. Attention deficit hyperactivity disorder (ADHD), combined type  F90.2 buPROPion  (WELLBUTRIN ) 75 MG tablet    4. 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 Tobacco use disorder  F17.200    Moderate      Past Psychiatric History: I have reviewed past psychiatric history from progress note on 04/24/2022.  Past trials of medications like BuSpar -side effect, Depakote-side effect, Lexapro , Zyprexa, Vyvanse , Adderall-psychosis/suicidality, carbamazepine  Past Medical History:  Past Medical History:  Diagnosis Date   Anxiety    Bipolar disorder (HCC)    Panic attacks     Past Surgical History:  Procedure Laterality Date  NO PAST SURGERIES      Family Psychiatric History: I have reviewed family psychiatric history from progress note on 04/24/2022.  Family History:  Family History  Problem Relation Age of Onset   Anxiety disorder Mother    Panic disorder Mother    Bipolar disorder Father    Schizophrenia Father    Alcohol  abuse Brother    Bipolar disorder Brother    ADD / ADHD Brother    Anxiety disorder Brother    Bipolar disorder Maternal Aunt    Alcohol abuse Maternal Grandfather    Cirrhosis Maternal Grandfather    Heart attack Maternal Grandmother    Bipolar disorder Paternal Grandmother    Thyroid disease Paternal Grandmother     Social History: I have reviewed social history from progress note on 04/24/2022. Social History   Socioeconomic History   Marital status: Married    Spouse name: Chelsie   Number of children: 3   Years of education: 11 TH GRADE   Highest education level: Not on file  Occupational History   Not on file  Tobacco Use   Smoking status: Former    Types: Cigarettes   Smokeless tobacco: Current    Types: Chew   Tobacco comments:    1 pack per week--03/20/2022  Vaping Use   Vaping status: Never Used  Substance and Sexual Activity   Alcohol use: Yes    Comment: occasinal   Drug use: No   Sexual activity: Yes    Partners: Female  Other Topics Concern   Not on file  Social History Narrative   Works at [employer]   Social Drivers of Health   Tobacco Use: High Risk (12/08/2024)   Patient History    Smoking Tobacco Use: Former    Smokeless Tobacco Use: Current    Passive Exposure: Not on Actuary Strain: Low Risk (09/28/2024)   Overall Financial Resource Strain (CARDIA)    Difficulty of Paying Living Expenses: Not hard at all  Food Insecurity: No Food Insecurity (09/28/2024)   Epic    Worried About Programme Researcher, Broadcasting/film/video in the Last Year: Never true    Ran Out of Food in the Last Year: Never true  Transportation Needs: No Transportation Needs (09/28/2024)   Epic    Lack of Transportation (Medical): No    Lack of Transportation (Non-Medical): No  Physical Activity: Inactive (09/28/2024)   Exercise Vital Sign    Days of Exercise per Week: 0 days    Minutes of Exercise per Session: 0 min  Stress: No Stress Concern Present (03/31/2023)   [employer] - Occupational Stress Questionnaire    Feeling of Stress : Only a little  Social Connections: Moderately Integrated (09/28/2024)   Social Connection and Isolation Panel    Frequency of Communication with Friends and Family: More than three times a week    Frequency of Social Gatherings with Friends and Family: More than three times a week    Attends Religious Services: 1 to 4 times per year    Active Member of Clubs or Organizations: No    Attends Banker Meetings: Never    Marital Status: Married  Depression (PHQ2-9): Medium Risk (09/28/2024)   Depression (PHQ2-9)    PHQ-2 Score: 8  Alcohol Screen: Low Risk (09/28/2024)   Alcohol Screen    Last Alcohol Screening Score (AUDIT): 0  Housing: Unknown (09/28/2024)   Epic    Unable to Pay for Housing in the  Last Year: No    Number of Times Moved in the Last Year: Not on file    Homeless in the Last Year: No  Utilities: Not At Risk (09/28/2024)   Epic    Threatened with loss of utilities: No  Health Literacy: Adequate Health Literacy (09/28/2024)   B1300 Health Literacy    Frequency of need for help with medical instructions: Never    Allergies: Allergies[1]  Metabolic Disorder Labs: Lab Results  Component Value Date   HGBA1C 5.6 09/28/2024   MPG 114 09/28/2024   MPG 123 03/31/2023   Lab Results  Component Value Date   PROLACTIN 15.5 12/03/2023   Lab Results  Component Value Date   CHOL 178 09/28/2024   TRIG 75 09/28/2024   HDL 50 09/28/2024   CHOLHDL 3.6 09/28/2024   LDLCALC 112 (H) 09/28/2024   LDLCALC 101 (H) 03/31/2023   Lab Results  Component Value Date   TSH 1.21 09/28/2024   TSH 1.12 03/19/2022    Therapeutic Level Labs: No results found for: LITHIUM No results found for: VALPROATE No results found for: CBMZ  Current Medications: Current Outpatient Medications  Medication Sig Dispense Refill   buPROPion  (WELLBUTRIN ) 75 MG tablet Take 1 tablet (75 mg  total) by mouth in the morning. 30 tablet 1   Azelastine HCl 137 MCG/SPRAY SOLN      clobetasol (TEMOVATE) 0.05 % external solution SMARTSIG:Sparingly Topical Twice Daily     escitalopram  (LEXAPRO ) 5 MG tablet Take 1 tablet (5 mg total) by mouth daily. 90 tablet 0   fluticasone  (FLONASE ) 50 MCG/ACT nasal spray Place 2 sprays into both nostrils daily. 16 g 6   ipratropium (ATROVENT ) 0.03 % nasal spray Place 2 sprays into both nostrils every 12 (twelve) hours. 30 mL 12   lamoTRIgine  (LAMICTAL ) 100 MG tablet Take 0.5 tablets (50 mg total) by mouth 2 (two) times daily. 30 tablet 2   No current facility-administered medications for this visit.     Musculoskeletal: Strength & Muscle Tone: UTA Gait & Station: Seated Patient leans: N/A  Psychiatric Specialty Exam: Review of Systems  Psychiatric/Behavioral:  Positive for decreased concentration and sleep disturbance. The patient is nervous/anxious.     There were no vitals taken for this visit.There is no height or weight on file to calculate BMI.  General Appearance: Casual  Eye Contact:  Fair  Speech:  Clear and Coherent  Volume:  Normal  Mood:  Anxious  Affect:  Appropriate  Thought Process:  Goal Directed and Descriptions of Associations: Intact  Orientation:  Full (Time, Place, and Person)  Thought Content: Logical   Suicidal Thoughts:  No  Homicidal Thoughts:  No  Memory:  Immediate;   Fair Recent;   Fair Remote;   Fair  Judgement:  Fair  Insight:  Fair  Psychomotor Activity:  Normal  Concentration:  Concentration: Fair and Attention Span: Fair  Recall:  [employer] of Knowledge: Fair  Language: Fair  Akathisia:  No  Handed:  Right  AIMS (if indicated): not done  Assets:  Communication Skills Desire for Improvement Housing Intimacy Resilience Social Support Talents/Skills  ADL's:  Intact  Cognition: WNL  Sleep:  varies   Screenings: Geneticist, Molecular Office Visit from 04/19/2024 in [facility] Office Visit from 04/14/2024 in [facility] Office Visit from 12/02/2023 in [facility]  AIMS Total Score 0 0 0   GAD-7  Flowsheet Row Office Visit from 09/28/2024 in [facility] Counselor from 04/27/2024 in [facility] Office Visit from 04/14/2024 in [facility] Office Visit from 12/02/2023 in [facility] Office Visit from 11/17/2023 in [facility]  Total GAD-7 Score 7 19 17 8 21    PHQ2-9    Flowsheet Row Office Visit from 09/28/2024 in [facility] Counselor from 05/11/2024 in [facility] Counselor from 04/27/2024 in [facility] Office Visit from 04/14/2024 in [facility] Office Visit from 12/02/2023 in [facility]  PHQ-2 Total Score 2 2 6 6 2   PHQ-9 Total Score 8 12 19 25 9    Flowsheet Row Video Visit from 10/13/2024 in [facility] Video Visit from 09/22/2024 in [facility] Video Visit from 08/19/2024 in [facility]  C-SSRS RISK CATEGORY No Risk No Risk No Risk     Assessment and Plan: JOELLE FLESSNER is a 39 year old Caucasian male who presented for a follow-up appointment, discussed assessment and plan as noted below.  1. Generalized anxiety disorder-improving Does have episodic anxiety although with improvement. Continue Lexapro  5 mg daily.  Will consider tapering off this medication in the future if side effects of feeling' flat' likely due to this medication. Continue propranolol  20 mg twice a day as  needed Will refer patient for CBT, I have sent communication to previous therapist Ms. Perkins.  2. Bipolar disorder, in full remission, most recent episode depressed Currently denies any significant mood swings. Continue Lamictal  75 mg daily  3. Attention deficit hyperactivity disorder (ADHD), combined type-unstable Currently struggling with cognitive issues, motivation and energy.  Previous trials of medications like Adderall and Strattera .  Strattera  was discontinued due to noncompliance Encouraged to be compliant on CPAP for sleep apnea, just restarted use a week ago. Start Wellbutrin  75 mg daily in the morning. Provided medication education.   4. Caffeine use disorder-improving Currently limited to 1 drink per day. Will reevaluate in future sessions.  5. Tobacco use disorder-improving Continues to chew tobacco. Will reevaluate in future sessions.  Follow-up Follow-up in clinic in 1 month or sooner in person.    Collaboration of Care: Collaboration of Care: {BH OP Collaboration of Care:[id number]}  Patient/Guardian was advised Release of Information must be obtained prior to any record release in order to collaborate their care with an outside provider. Patient/Guardian was advised if they have not already done so to contact the registration department to sign all necessary forms in order for us  to release information regarding their care.   Consent: Patient/Guardian gives verbal consent for treatment and assignment of benefits for services provided during this visit. Patient/Guardian expressed understanding and agreed to proceed.    Yumalay Circle, MD 12/08/2024, 5:29 PM     [1]  Allergies Allergen Reactions   Adderall [Amphetamine -Dextroamphetamine] Other (See Comments)    Crying , paranoia , suicidality   Depakote [Divalproex Sodium] Anaphylaxis

## 2024-12-08 NOTE — Patient Instructions (Signed)
Bupropion Tablets (Depression/Mood Disorders) What is this medication? BUPROPION (byoo PROE pee on) treats depression. It increases norepinephrine and dopamine in the brain, hormones that help regulate mood. It belongs to a group of medications called NDRIs. This medicine may be used for other purposes; ask your health care provider or pharmacist if you have questions. COMMON BRAND NAME(S): Wellbutrin What should I tell my care team before I take this medication? They need to know if you have any of these conditions: An eating disorder, such as anorexia or bulimia Bipolar disorder or psychosis Diabetes or high blood sugar, treated with medication Glaucoma Head injury or brain tumor Heart disease, previous heart attack, or irregular heart beat High blood pressure Kidney disease Liver disease Seizures Suicidal thoughts, plans, or attempt by you or a family member Tourette syndrome Weight loss An unusual or allergic reaction to bupropion, other medications, foods, dyes, or preservatives Pregnant or trying to become pregnant Breastfeeding How should I use this medication? Take this medication by mouth with a glass of water. Follow the directions on the prescription label. You can take it with or without food. If it upsets your stomach, take it with food. Take your medication at regular intervals. Do not take your medication more often than directed. Do not stop taking this medication suddenly except upon the advice of your care team. Stopping this medication too quickly may cause serious side effects or your condition may worsen. A special MedGuide will be given to you by the pharmacist with each prescription and refill. Be sure to read this information carefully each time. Talk to your care team regarding the use of this medication in children. Special care may be needed. Overdosage: If you think you have taken too much of this medicine contact a poison control center or emergency room at  once. NOTE: This medicine is only for you. Do not share this medicine with others. What if I miss a dose? If you miss a dose, take it as soon as you can. If it is less than four hours to your next dose, take only that dose and skip the missed dose. Do not take double or extra doses. What may interact with this medication? Do not take this medication with any of the following: Linezolid MAOIs, such as Azilect, Carbex, Eldepryl, Marplan, Nardil, and Parnate Methylene blue (injected into a vein) Other medications that contain bupropion, such as Zyban This medication may also interact with the following: Alcohol Certain medications for anxiety or sleep Certain medications for blood pressure, such as metoprolol, propranolol Certain medications for HIV or AIDS, such as efavirenz, lopinavir, nelfinavir, ritonavir Certain medications for irregular heartbeat, such as propafenone, flecainide Certain medications for mental health conditions Certain medications for Parkinson disease, such as amantadine, levodopa Certain medications for seizures, such as carbamazepine, phenytoin, phenobarbital Cimetidine Clopidogrel Cyclophosphamide Digoxin Furazolidone Isoniazid Nicotine Orphenadrine Procarbazine Steroid medications, such as prednisone or cortisone Stimulant medications for attention disorders, weight loss, or to stay awake Tamoxifen Theophylline Thiotepa Ticlopidine Tramadol Warfarin This list may not describe all possible interactions. Give your health care provider a list of all the medicines, herbs, non-prescription drugs, or dietary supplements you use. Also tell them if you smoke, drink alcohol, or use illegal drugs. Some items may interact with your medicine. What should I watch for while using this medication? Tell your care team if your symptoms do not get better or if they get worse. Visit your care team for regular checks on your progress. Because it may take several  weeks to see  the full effects of this medication, it is important to continue your treatment as prescribed. This medication may cause thoughts of suicide or depression. This includes sudden changes in mood, behaviors, or thoughts. These changes can happen at any time but are more common in the beginning of treatment or after a change in dose. Call your care team right away if you experience these thoughts or worsening depression. This medication may cause mood and behavior changes, such as anxiety, nervousness, irritability, hostility, restlessness, excitability, hyperactivity, or trouble sleeping. These changes can happen at any time but are more common in the beginning of treatment or after a change in dose. Call your care team right away if you notice any of these symptoms. This medication may cause serious skin reactions. They can happen weeks to months after starting the medication. Contact your care team right away if you notice fevers or flu-like symptoms with a rash. The rash may be red or purple and then turn into blisters or peeling of the skin. You may also notice a red rash with swelling of the face, lips, or lymph nodes in your neck or under your arms. Avoid drinks that contain alcohol while taking this medication. Drinking large amounts of alcohol, using sleeping or anxiety medications, or quickly stopping the use of these agents while taking this medication may increase your risk for a seizure. This medication may affect your coordination, reaction time, or judgment. Do not drive or operate machinery until you know how this medication affects you. Do not take this medication close to bedtime. It may prevent you from sleeping. Your mouth may get dry. Chewing sugarless gum or sucking hard candy and drinking plenty of water may help. Contact your care team if the problem does not go away or is severe. What side effects may I notice from receiving this medication? Side effects that you should report to your  care team as soon as possible: Allergic reactions--skin rash, itching, hives, swelling of the face, lips, tongue, or throat Increase in blood pressure Mood and behavior changes--anxiety, nervousness, confusion, hallucinations, irritability, hostility, thoughts of suicide or self-harm, worsening mood, feelings of depression Redness, blistering, peeling, or loosening of the skin, including inside the mouth Seizures Sudden eye pain or change in vision such as blurry vision, seeing halos around lights, vision loss Side effects that usually do not require medical attention (report to your care team if they continue or are bothersome): Constipation Dizziness Dry mouth Loss of appetite Nausea Tremors or shaking Trouble sleeping This list may not describe all possible side effects. Call your doctor for medical advice about side effects. You may report side effects to FDA at 1-800-FDA-1088. Where should I keep my medication? Keep out of the reach of children and pets. Store at room temperature between 20 and 25 degrees C (68 and 77 degrees F), away from direct sunlight and moisture. Keep tightly closed. Throw away any unused medication after the expiration date. NOTE: This sheet is a summary. It may not cover all possible information. If you have questions about this medicine, talk to your doctor, pharmacist, or health care provider.  2024 Elsevier/Gold Standard (2022-07-28 00:00:00)

## 2024-12-16 ENCOUNTER — Ambulatory Visit (INDEPENDENT_AMBULATORY_CARE_PROVIDER_SITE_OTHER): Admitting: Licensed Clinical Social Worker

## 2024-12-16 DIAGNOSIS — F411 Generalized anxiety disorder: Secondary | ICD-10-CM

## 2024-12-16 DIAGNOSIS — F3176 Bipolar disorder, in full remission, most recent episode depressed: Secondary | ICD-10-CM

## 2024-12-16 DIAGNOSIS — F902 Attention-deficit hyperactivity disorder, combined type: Secondary | ICD-10-CM | POA: Diagnosis not present

## 2024-12-16 DIAGNOSIS — F17200 Nicotine dependence, unspecified, uncomplicated: Secondary | ICD-10-CM | POA: Diagnosis not present

## 2024-12-16 DIAGNOSIS — F1590 Other stimulant use, unspecified, uncomplicated: Secondary | ICD-10-CM | POA: Diagnosis not present
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
## 2024-12-21 ENCOUNTER — Ambulatory Visit: Admitting: Licensed Clinical Social Worker

## 2024-12-21 ENCOUNTER — Encounter: Payer: Self-pay | Admitting: Licensed Clinical Social Worker

## 2024-12-21 DIAGNOSIS — F3176 Bipolar disorder, in full remission, most recent episode depressed: Secondary | ICD-10-CM

## 2024-12-21 DIAGNOSIS — F411 Generalized anxiety disorder: Secondary | ICD-10-CM | POA: Diagnosis not present

## 2024-12-21 DIAGNOSIS — F902 Attention-deficit hyperactivity disorder, combined type: Secondary | ICD-10-CM

## 2024-12-21 DIAGNOSIS — F17200 Nicotine dependence, unspecified, uncomplicated: Secondary | ICD-10-CM

## 2024-12-21 DIAGNOSIS — F1590 Other stimulant use, unspecified, uncomplicated: Secondary | ICD-10-CM

## 2025-01-13 ENCOUNTER — Ambulatory Visit: Admitting: Psychiatry

## 2025-01-18 ENCOUNTER — Ambulatory Visit: Admitting: Licensed Clinical Social Worker

## 2025-09-29 ENCOUNTER — Encounter: Admitting: Nurse Practitioner
